# Patient Record
Sex: Male | Born: 1995 | Race: White | Hispanic: No | Marital: Single | Smoking: Never smoker
Health system: Southern US, Community
[De-identification: ages and names within clinical notes are randomized; demographics above are authoritative.]

## PROBLEM LIST (undated history)

## (undated) ENCOUNTER — Emergency Department: Admission: EM | Payer: Medicaid Other

## (undated) DIAGNOSIS — F952 Tourette's disorder: Secondary | ICD-10-CM

## (undated) DIAGNOSIS — F429 Obsessive-compulsive disorder, unspecified: Secondary | ICD-10-CM

## (undated) DIAGNOSIS — F909 Attention-deficit hyperactivity disorder, unspecified type: Secondary | ICD-10-CM

## (undated) DIAGNOSIS — R079 Chest pain, unspecified: Secondary | ICD-10-CM

## (undated) DIAGNOSIS — F419 Anxiety disorder, unspecified: Secondary | ICD-10-CM

## (undated) DIAGNOSIS — J45909 Unspecified asthma, uncomplicated: Secondary | ICD-10-CM

## (undated) HISTORY — DX: Unspecified asthma, uncomplicated: J45.909

## (undated) HISTORY — DX: Chest pain, unspecified: R07.9

## (undated) HISTORY — PX: INGUINAL HERNIA REPAIR: SUR1180

---
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
## 2017-01-12 ENCOUNTER — Ambulatory Visit: Payer: Self-pay | Admitting: Family Medicine

## 2018-04-13 ENCOUNTER — Encounter: Payer: Self-pay | Admitting: Emergency Medicine

## 2018-04-13 ENCOUNTER — Emergency Department: Payer: Self-pay

## 2018-04-13 ENCOUNTER — Emergency Department
Admission: EM | Admit: 2018-04-13 | Discharge: 2018-04-13 | Disposition: A | Discharge status: Home / Self Care | Payer: Self-pay | Attending: Student in an Organized Health Care Education/Training Program | Admitting: Student in an Organized Health Care Education/Training Program

## 2018-04-13 ENCOUNTER — Other Ambulatory Visit: Payer: Self-pay

## 2018-04-13 DIAGNOSIS — J45909 Unspecified asthma, uncomplicated: Secondary | ICD-10-CM | POA: Insufficient documentation

## 2018-04-13 DIAGNOSIS — F419 Anxiety disorder, unspecified: Secondary | ICD-10-CM

## 2018-04-13 HISTORY — DX: Tourette's disorder: F95.2

## 2018-04-13 LAB — CBC
HCT: 49.3 % (ref 40.0–52.0)
HEMOGLOBIN: 17.3 g/dL (ref 13.0–18.0)
MCH: 29.3 pg (ref 26.0–34.0)
MCHC: 35 g/dL (ref 32.0–36.0)
MCV: 83.7 fL (ref 80.0–100.0)
Platelets: 363 10*3/uL (ref 150–440)
RBC: 5.89 MIL/uL (ref 4.40–5.90)
RDW: 13.3 % (ref 11.5–14.5)
WBC: 11 10*3/uL — ABNORMAL HIGH (ref 3.8–10.6)

## 2018-04-13 LAB — FIBRIN DERIVATIVES D-DIMER (ARMC ONLY): Fibrin derivatives D-dimer (ARMC): 121.83 ng/mL (FEU) (ref 0.00–499.00)

## 2018-04-13 LAB — BASIC METABOLIC PANEL
ANION GAP: 11 (ref 5–15)
BUN: 17 mg/dL (ref 6–20)
CO2: 25 mmol/L (ref 22–32)
Calcium: 9.9 mg/dL (ref 8.9–10.3)
Chloride: 105 mmol/L (ref 98–111)
Creatinine, Ser: 0.69 mg/dL (ref 0.61–1.24)
GFR calc Af Amer: >60 mL/min (ref >60)
GFR calc non Af Amer: >60 mL/min (ref >60)
GLUCOSE: 98 mg/dL (ref 70–99)
POTASSIUM: 3.8 mmol/L (ref 3.5–5.1)
Sodium: 141 mmol/L (ref 135–145)

## 2018-04-13 LAB — TROPONIN I

## 2018-04-13 LAB — PHOSPHORUS: PHOSPHORUS: 3.8 mg/dL (ref 2.5–4.6)

## 2018-04-13 MED ORDER — LORAZEPAM 0.5 MG PO TABS: 0.5000 mg | ORAL_TABLET | Freq: Three times a day (TID) | ORAL | 0 refills | Status: AC | PRN

## 2018-04-13 MED ORDER — LORAZEPAM 0.5 MG PO TABS: 0.5000 mg | ORAL_TABLET | Freq: Four times a day (QID) | ORAL | Status: DC | PRN

## 2018-04-13 MED ORDER — LORAZEPAM 0.5 MG PO TABS
0.5000 mg | ORAL_TABLET | Freq: Once | ORAL | Status: AC
  Administered 2018-04-13: 0.5 mg via ORAL
  Filled 2018-04-13: qty 1

## 2018-04-13 NOTE — ED Triage Notes (Addendum)
Pt to ed with c/o shortness of breath and apnea when he tries to fall asleep.  Pt states he feels himself and then wakes up.  Reports this started yesterday and that he has not had sleep in the last 24 hours. Pt also reports a similar incident in 2014 and that he had a low phosphorous.

## 2018-04-13 NOTE — ED Provider Notes (Signed)
[facility] Emergency Department Provider Note    First MD Initiated Contact with Patient 04/13/18 1544     (approximate)  I have reviewed the triage vital signs and the nursing notes.   HISTORY  Chief Complaint Shortness of Breath    HPI Gerald Olsen is a 22 y.o. male history of Tourette's and asthma presents the ER chief complaint of shortness of breath only happens when he is trying to fall asleep.  States he will fall asleep and close his eyes for a few seconds and then wake up gasping for breath.  States is been happening worse over the past 2 days.  States this happened once before years ago when he was found to have low phosphorus.  He denies any chest pain or shortness of breath at this time.  States he did not get any sleep last night.  States that he is been feeling very anxious.  Is not taking medication for this or for Tourette's.  Denies any SI.  Has previously had passive thoughts of hurting himself but does not have any right now.  He is accompanied by his father who reiterates that he has no concern for self-harm the patient is otherwise behaving normally and was brought to the ER due to concern for these breathing spells which he has had in the past.    Past Medical History:  Diagnosis Date  . Asthma   . Chest pain   . Tourette's    Family History  Problem Relation Age of Onset  . Diabetes Father   . Heart disease Father   . Hyperlipidemia Father   . Hypertension Father    Past Surgical History:  Procedure Laterality Date  . INGUINAL HERNIA REPAIR  Infant   As Infant   There are no active problems to display for this patient.     Prior to Admission medications   Medication Sig Start Date End Date Taking? Authorizing Provider  LORazepam (ATIVAN) 0.5 MG tablet Take 1 tablet (0.5 mg total) by mouth every 8 (eight) hours as needed for anxiety. 04/13/18 04/13/19  Willy Eddyobinson, Ioanna Colquhoun, MD    Allergies Patient has no known  allergies.    Social History Social History   Tobacco Use  . Smoking status: Never Smoker  . Smokeless tobacco: Never Used  Substance Use Topics  . Alcohol use: No  . Drug use: Never    Review of Systems Patient denies headaches, rhinorrhea, blurry vision, numbness, shortness of breath, chest pain, edema, cough, abdominal pain, nausea, vomiting, diarrhea, dysuria, fevers, rashes or hallucinations unless otherwise stated above in HPI. ____________________________________________   PHYSICAL EXAM:  VITAL SIGNS: Vitals:   04/13/18 1605 04/13/18 1721  BP: (!) 161/82 133/68  Pulse: (!) 104 99  Resp: 16 18  Temp:    SpO2: 98% 99%    Constitutional: Alert and oriented.  Eyes: Conjunctivae are normal.  Head: Atraumatic. Nose: No congestion/rhinnorhea. Mouth/Throat: Mucous membranes are moist.   Neck: No stridor. Painless ROM.  Cardiovascular: Normal rate, regular rhythm. Grossly normal heart sounds.  Good peripheral circulation. Respiratory: Normal respiratory effort.  No retractions. Lungs CTAB. Gastrointestinal: Soft and nontender. No distention. No abdominal bruits. No CVA tenderness. Genitourinary:  Musculoskeletal: No lower extremity tenderness nor edema.  No joint effusions. Neurologic:  Normal speech and language. No gross focal neurologic deficits are appreciated. No facial droop Skin:  Skin is warm, dry and intact. No rash noted. Psychiatric: anxious but organized thoughts, Speech and behavior are normal.  ____________________________________________   LABS (all labs ordered are listed, but only abnormal results are displayed)  Results for orders placed or performed during the hospital encounter of 04/13/18 (from the past 24 hour(s))  Basic metabolic panel     Status: None   Collection Time: 04/13/18  2:01 PM  Result Value Ref Range   Sodium 141 135 - 145 mmol/L   Potassium 3.8 3.5 - 5.1 mmol/L   Chloride 105 98 - 111 mmol/L   CO2 25 22 - 32 mmol/L    Glucose, Bld 98 70 - 99 mg/dL   BUN 17 6 - 20 mg/dL   Creatinine, Ser 4.74 0.61 - 1.24 mg/dL   Calcium 9.9 8.9 - 25.9 mg/dL   GFR calc non Af Amer >60 >60 mL/min   GFR calc Af Amer >60 >60 mL/min   Anion gap 11 5 - 15  CBC     Status: Abnormal   Collection Time: 04/13/18  2:01 PM  Result Value Ref Range   WBC 11.0 (H) 3.8 - 10.6 K/uL   RBC 5.89 4.40 - 5.90 MIL/uL   Hemoglobin 17.3 13.0 - 18.0 g/dL   HCT 56.3 87.5 - 64.3 %   MCV 83.7 80.0 - 100.0 fL   MCH 29.3 26.0 - 34.0 pg   MCHC 35.0 32.0 - 36.0 g/dL   RDW 32.9 51.8 - 84.1 %   Platelets 363 150 - 440 K/uL  Troponin I     Status: None   Collection Time: 04/13/18  2:01 PM  Result Value Ref Range   Troponin I <0.03 <0.03 ng/mL  Phosphorus     Status: None   Collection Time: 04/13/18  2:01 PM  Result Value Ref Range   Phosphorus 3.8 2.5 - 4.6 mg/dL  Fibrin derivatives D-Dimer ([facility] only)     Status: None   Collection Time: 04/13/18  2:02 PM  Result Value Ref Range   Fibrin derivatives D-dimer ([facility]) 121.83 0.00 - 499.00 ng/mL (FEU)   ____________________________________________  EKG My review and personal interpretation at Time: 14:07  Indication: sob  Rate: 105  Rhythm: sinus Axis: normal  Other: normal intervals, no stemi ____________________________________________  RADIOLOGY  I personally reviewed all radiographic images ordered to evaluate for the above acute complaints and reviewed radiology reports and findings.  These findings were personally discussed with the patient.  Please see medical record for radiology report.  ____________________________________________   PROCEDURES  Procedure(s) performed:  Procedures    Critical Care performed: no ____________________________________________   INITIAL IMPRESSION / ASSESSMENT AND PLAN / ED COURSE  Pertinent labs & imaging results that were available during my care of the patient were reviewed by me and considered in my medical decision making (see chart for  details).   DDX: Asthma, copd, CHF, pna, ptx, malignancy, Pe, anemia   COTY LARSH is a 22 y.o. who presents to the ED with his as described above.  Patient arrives mildly tachycardic but no hypoxia.  No fever.  Risk heart score.  EKG shows no evidence of preexcitation syndrome or ischemia.  Low risk by [place].  D-dimer is negative.  Presentation seems most clinically consistent with panic.  Patient given trial dose of Ativan with improvement in symptoms.  He denies any SI or HI.  No intent for self-harm.  He does have good support and agrees to follow-up with [facility] and counselor as needed.      As part of my medical decision making, I reviewed the following data within the  electronic MEDICAL RECORD NUMBER Nursing notes reviewed and incorporated, Labs reviewed, notes from prior ED visits  ____________________________________________   FINAL CLINICAL IMPRESSION(S) / ED DIAGNOSES  Final diagnoses:  Anxiety      NEW MEDICATIONS STARTED DURING THIS VISIT:  There are no discharge medications for this patient.    Note:  This document was prepared using Dragon voice recognition software and may include unintentional dictation errors.    Willy Eddy, MD 04/13/18 [phone]

## 2018-04-13 NOTE — ED Notes (Signed)
Patient is restless on the stretcher. Patient has Tourette's and is constantly in motion. Patient is interacting easily with his father and this Clinical research associatewriter.

## 2018-04-17 ENCOUNTER — Ambulatory Visit: Payer: Self-pay | Admitting: Family Medicine

## 2018-04-18 ENCOUNTER — Other Ambulatory Visit: Payer: Self-pay

## 2018-04-18 ENCOUNTER — Ambulatory Visit: Payer: Self-pay | Admitting: Family Medicine

## 2019-04-14 ENCOUNTER — Encounter: Payer: Self-pay | Admitting: Emergency Medicine

## 2019-04-14 ENCOUNTER — Other Ambulatory Visit: Payer: Self-pay

## 2019-04-14 DIAGNOSIS — F329 Major depressive disorder, single episode, unspecified: Secondary | ICD-10-CM | POA: Insufficient documentation

## 2019-04-14 DIAGNOSIS — F419 Anxiety disorder, unspecified: Secondary | ICD-10-CM | POA: Insufficient documentation

## 2019-04-14 DIAGNOSIS — Z046 Encounter for general psychiatric examination, requested by authority: Secondary | ICD-10-CM | POA: Insufficient documentation

## 2019-04-14 DIAGNOSIS — R0989 Other specified symptoms and signs involving the circulatory and respiratory systems: Secondary | ICD-10-CM | POA: Insufficient documentation

## 2019-04-14 DIAGNOSIS — R1310 Dysphagia, unspecified: Secondary | ICD-10-CM | POA: Insufficient documentation

## 2019-04-14 DIAGNOSIS — J45909 Unspecified asthma, uncomplicated: Secondary | ICD-10-CM | POA: Insufficient documentation

## 2019-04-14 NOTE — ED Triage Notes (Signed)
Patient ambulatory to triage with steady gait, without difficulty or distress noted, mask in place; pt reports difficulty swallowing; denies recent illness; denies pain or irritation; st "just feels like nothing goes down"; denies any accomp symptoms

## 2019-04-15 ENCOUNTER — Emergency Department: Payer: Self-pay

## 2019-04-15 ENCOUNTER — Emergency Department
Admission: EM | Admit: 2019-04-15 | Discharge: 2019-04-15 | Disposition: A | Discharge status: Home / Self Care | Payer: Self-pay | Attending: Emergency Medicine | Admitting: Emergency Medicine

## 2019-04-15 DIAGNOSIS — R1310 Dysphagia, unspecified: Secondary | ICD-10-CM

## 2019-04-15 DIAGNOSIS — F419 Anxiety disorder, unspecified: Secondary | ICD-10-CM

## 2019-04-15 DIAGNOSIS — F329 Major depressive disorder, single episode, unspecified: Secondary | ICD-10-CM | POA: Diagnosis present

## 2019-04-15 DIAGNOSIS — F32A Depression, unspecified: Secondary | ICD-10-CM | POA: Diagnosis present

## 2019-04-15 HISTORY — DX: Attention-deficit hyperactivity disorder, unspecified type: F90.9

## 2019-04-15 NOTE — ED Provider Notes (Signed)
[facility] Emergency Department Provider Note  ____________________________________________   First MD Initiated Contact with Patient 04/15/19 [id number]     (approximate)  I have reviewed the triage vital signs and the nursing notes.   HISTORY  Chief Complaint Dysphagia    HPI Gerald Olsen is a 23 y.o. male with medical history as listed below who also fairly offers that he is wishing to transition from male to male.  He presents for evaluation of trouble swallowing.  He says it is been present for a few months and it seems to be getting worse.  He can swallow liquids without any difficulty but anytime he tries to eat any kind of food, including peanut butter or yogurt, he feels like he cannot swallow it.  He says it started after having an episode where he choked on a piece of chicken and it scared him very badly because he said he was going to die.  Since that time he can just be lying in bed thinking about it and feel like he is choking and food definitely makes it worse.  He was seen by his primary care doctor 2 months ago and [facility] bar[facility] swallow [facility], but he did not go to the study and has not had any additional follow-up.  He denies swelling.  He denies sore throat.  He has not had contact with COVID-19 patients.  He denies fever/chills, nausea, vomiting, abdominal pain, and dysuria.  He occasionally has some chest pain shortness of breath but this is chronic for him and attributable to his anxiety.  He was seen about a year ago for a variety of symptoms (not including the difficulty swallowing) and it was determined that anxiety was playing a role.  He was told he should follow-up with [facility] for mental health services but he admits that he did not follow-up as an outpatient.  Overall he describes his symptoms as severe and nothing in particular makes them better.  He is somewhat evasive when asked about suicidal ideation but states  that he does not want to die but sometimes he has "invasive thoughts".         Past Medical History:  Diagnosis Date  . ADHD   . Asthma   . Chest pain   . Tourette's     Patient Active Problem List   Diagnosis Date Noted  . Chest pain 04/09/2013    Past Surgical History:  Procedure Laterality Date  . INGUINAL HERNIA REPAIR  Infant   As Infant    Prior to Admission medications   Medication Sig Start Date End Date Taking? Authorizing Provider  acetaminophen-codeine (TYLENOL #3) 300-30 MG tablet TAKE 1-2 TABS BY MOUTH TWICE A DAY AS NEEDED 01/16/18   [provider]    Allergies Patient has no known allergies.  Family History  Problem Relation Age of Onset  . Diabetes Father   . Heart disease Father   . Hyperlipidemia Father   . Hypertension Father     Social History Social History   Tobacco Use  . Smoking status: Never Smoker  . Smokeless tobacco: Never Used  Substance Use Topics  . Alcohol use: No  . Drug use: Never    Review of Systems Constitutional: No fever/chills Eyes: No visual changes. ENT: No sore throat. Cardiovascular: Denies chest pain. Respiratory: Denies shortness of breath. Gastrointestinal: Difficulty swallowing anything other than liquids.  No abdominal pain.  No nausea, no vomiting.  No diarrhea.  No constipation. Genitourinary: Negative for dysuria. Musculoskeletal: Negative for neck pain.  Negative for back pain. Integumentary: Negative for rash. Neurological: Negative for headaches, focal weakness or numbness. Psychiatric:  Depression and anxiety, some OCD tendencies.  ____________________________________________   PHYSICAL EXAM:  VITAL SIGNS: ED Triage Vitals  Enc Vitals Group     BP 04/14/19 2331 (!) 148/79     Pulse Rate 04/14/19 2331 90     Resp 04/14/19 2331 20     Temp 04/14/19 2331 98.5 F (36.9 C)     Temp Source 04/14/19 2331 Oral     SpO2 04/14/19 2331 100 %     Weight 04/14/19 2332 80.1 kg (176 lb 8  oz)     Height --      Head Circumference --      Peak Flow --      Pain Score 04/14/19 2331 0     Pain Loc --      Pain Edu? --      Excl. in [place]? --     Constitutional: Alert and oriented. Well appearing and in no acute distress.  Comports himself and dresses himself in a more typically male fashion, consistent with his report to me that he wishes to transition from male to male. Eyes: Conjunctivae are normal.  Head: Atraumatic. Nose: No congestion/rhinnorhea. Mouth/Throat: No trismus.  Mucous membranes are moist.  Normal-appearing uvula, no evidence of peritonsillar abscess or acute infection of any sort. Neck: No stridor.  No meningeal signs.  No palpable lymphadenopathy, no induration or fluctuance. Cardiovascular: Normal rate, regular rhythm. Good peripheral circulation. Grossly normal heart sounds. Respiratory: Normal respiratory effort.  No retractions. No audible wheezing. Gastrointestinal: Soft and nontender. No distention.  Musculoskeletal: No lower extremity tenderness nor edema. No gross deformities of extremities. Neurologic:  Normal speech and language. No gross focal neurologic deficits are appreciated.  Skin:  Skin is warm, dry and intact. No rash noted. Psychiatric: The patient has occasional minor tics consistent with his history of Tourette's syndrome.  He is reporting anxiety and depression but denies active suicidality and has no plan and says he does not want to harm himself but he feels like he needs help with his depression.  ____________________________________________   LABS (all labs ordered are listed, but only abnormal results are displayed)  Labs Reviewed - No data to display ____________________________________________  EKG  No indication for EKG ____________________________________________  RADIOLOGY I, Hinda Kehr, personally viewed and evaluated these images (plain radiographs) as part of my medical decision making, as well as reviewing the  written report by the radiologist.  ED MD interpretation:  No acute abnormalities on radiographs  Official radiology report(s): Dg Neck Soft Tissue  Result Date: 04/15/2019 CLINICAL DATA:  Dysphagia. EXAM: NECK SOFT TISSUES - 1+ VIEW COMPARISON:  None. FINDINGS: There is no evidence of retropharyngeal soft tissue swelling or epiglottic enlargement. The cervical airway is unremarkable. No radio-opaque foreign body identified. Soft tissue planes are non suspicious. IMPRESSION: Negative soft tissue neck radiographs. Electronically Signed   By: Keith Rake M.D.   On: 04/15/2019 03:54    ____________________________________________   PROCEDURES   Procedure(s) performed (including Critical Care):  Procedures   ____________________________________________   INITIAL IMPRESSION / MDM / [place] / ED COURSE  As part of my medical decision making, I reviewed the following data within the [place] notes reviewed and incorporated, Old chart reviewed, Radiograph reviewed , Notes from prior ED visits and [place] Controlled Substance  Database   Differential diagnosis includes, but is not limited to, anxiety, depression, mood disorder, adjustment disorder, esophageal web, achalasia, any 1 of the number of other esophageal mechanical obstructions.  The patient has no sign or symptom of neck abscess or infection or hematoma nor a peritonsillar abscess and the symptoms have been going on for more than 2 months.  He very clearly describes to me an incident where he felt like he was choking to death and since that time he has had difficulty swallowing.  This seems more consistent with PTSD over his incident and he admits this is very likely.  After we talked a little bit about his need to follow-up with gastroenterology he brought up the more psychiatric nature of his concerns.  He feels very strongly that he would benefit from speaking with a psychiatrist and does admit  to some invasive thoughts, but I believe that he has good insight and judgment into his situation and I do not feel that he represents a danger to himself.  Of note, in reviewing the ED record from a year ago, his father was present at the time and was also adamant that the patient does not represent a danger to himself and they are concerned that he will be committed or hospitalized which they feel would be detrimental for him.  I agree that he does not meet involuntary commitment criteria but may benefit from psychiatric evaluation and follow-up as an outpatient.  Annice PihJackie the nurse practitioner with psychiatry is currently present in the department and I have asked her to evaluate him and see if she has any specific recommendations.  The patient is comfortable with this plan.  I will plan to discharge him after psychiatric evaluation with resources for outpatient follow-up with both gastroenterology and psychiatry.      Clinical Course as of Apr 14 606  Tue Apr 15, 2019  [id number] Annice PihJackie with psychiatry evaluted the patient.  They had a long talk and the patient felt better after speaking with Annice PihJackie.  She agrees with me that the patient does not meet any involuntary commitment criteria nor inpatient treatment criteria but would definitely benefit from [facility].  She also agrees with me that prescribed medications at this time without established follow-up would not be beneficial.  The patient is being provided with outpatient resources including [facility] information I am also providing some information and discharge paperwork.  I gave my usual customary return precautions.   [CF]    Clinical Course User Index [CF] Loleta RoseForbach, Charisa Twitty, MD     ____________________________________________  FINAL CLINICAL IMPRESSION(S) / ED DIAGNOSES  Final diagnoses:  Anxiety  Dysphagia, unspecified type     MEDICATIONS GIVEN DURING THIS VISIT:  Medications - No data to display   ED Discharge Orders     None      *Please note:  Gerald Olsen was evaluated in Emergency Department on 04/15/2019 for the symptoms described in the history of present illness. He was evaluated in the context of the global COVID-19 pandemic, which necessitated consideration that the patient might be at risk for infection with the SARS-CoV-2 virus that causes COVID-19. Institutional protocols and algorithms that pertain to the evaluation of patients at risk for COVID-19 are in a state of rapid change based on information released by regulatory bodies including the CDC and federal and state organizations. These policies and algorithms were followed during the patient's care in the ED.  Some ED evaluations and interventions may be  delayed as a result of limited staffing during the pandemic.*  Note:  This document was prepared using Dragon voice recognition software and may include unintentional dictation errors.   Loleta RoseForbach, Maansi Wike, MD 04/15/19 [phone]

## 2019-04-15 NOTE — Discharge Instructions (Signed)
As we discussed, I believe that you will benefit from seeing a gastroenterologist to rule out any mechanical issues with swallowing, but it is most likely that your issues arise from some of the psychiatric issues with which you are dealing, such as anxiety, OCD, etc.  We recommend that you call the office of Dr. Marius Ditch and schedule an outpatient follow-up visit, but we strongly encourage you to follow-up at [facility] for additional mental health services.  Return immediately to the emergency department if you develop any thoughts or plan for harming yourself or anyone else.

## 2019-04-15 NOTE — Consult Note (Signed)
[facility] Face-to[facility]Face [facility]iat[facility] Consult   Reason for Consult:  Dysphagia  Referring Physician: Dr. York CeriseForbach Patient Identification: Gerald Olsen MRN:  [id number] Principal Diagnosis: <principal problem not specified> Diagnosis:  Active Problems:   Depression   Total Time spent with patient: 1 hour  Subjective: " Are you able to prescribe me some medication? Gerald Olsen is a 23 y.o. male patient presented to [facility] ED via POV voluntarily.  This is a male who is currently transitioning to a male. She preferred to be called [name].  The patient was seen face-to-face by this provider; chart reviewed and consulted with Dr. York CeriseForbach on 04/15/2019 due to the care of the patient. It was discussed with the  provider that the patient does not meet criteria to be admitted to the inpatient unit.  On evaluation the patient is alert and oriented x4, anxious, but cooperative, and mood-congruent with affect.  During the patient assessment she voice several questions on gender dysphoria along with her starting hormonal therapy.  The patient discuss lots of family dynamics due to her transitioning. She discussed that her mom is more accepting to her choice than dad.  The patient admits to being diagnosed with ADHD and Tourette's syndrome but not OCD.  She explained having difficulty dealing with rituals wish she voiced it has consumed her life. The patient does not appear to be responding to internal or external stimuli. Neither is the patient presenting with any delusional thinking. The patient denies auditory or visual hallucinations. The patient denies any suicidal, homicidal, or self-harm ideations. She voiced that she love her life and would never do anything to harm herself or anyone else. The patient is not presenting with any psychotic or paranoid behaviors.  This provider spoke with TTS Ms. Christell ConstantMoore in requesting resources due to the patient not having health insurance at this time.  The patient discussed  needing providers who specializes gender dysphoria.  HPI: Per Dr. Henriette CombsFobach Gerald Olsen is a 23 y.o. male with medical history as listed below who also fairly offers that he is wishing to transition from male to male.  He presents for evaluation of trouble swallowing.  He says it is been present for a few months and it seems to be getting worse.  He can swallow liquids without any difficulty but anytime he tries to eat any kind of food, including peanut butter or yogurt, he feels like he cannot swallow it.  He says it started after having an episode where he choked on a piece of chicken and it scared him very badly because he said he was going to die.  Since that time he can just be lying in bed thinking about it and feel like he is choking and food definitely makes it worse.  He was seen by his primary care doctor 2 months ago and [facility], but he did not go to the study and has not had any additional follow-up.  He denies swelling.  He denies sore throat.  He has not had contact with COVID-19 patients.  He denies fever/chills, nausea, vomiting, abdominal pain, and dysuria.  He occasionally has some chest pain shortness of breath but this is chronic for him and attributable to his anxiety.  He was seen about a year ago for a variety of symptoms (not including the difficulty swallowing) and it was determined that anxiety was playing a role.  He was told he should follow-up with [facility] for mental health services  but he admits that he did not follow-up as an outpatient.  Overall he describes his symptoms as severe and nothing in particular makes them better.  He is somewhat evasive when asked about suicidal ideation but states that he does not want to die but sometimes he has "invasive thoughts".        Past Psychiatric History:  ADHD  Risk to Self:  No Risk to Others:  No Prior Inpatient Therapy:  No Prior Outpatient Therapy:   No  Past Medical  History:  Past Medical History:  Diagnosis Date  . ADHD   . Asthma   . Chest pain   . Tourette's     Past Surgical History:  Procedure Laterality Date  . INGUINAL HERNIA REPAIR  Infant   As Infant   Family History:  Family History  Problem Relation Age of Onset  . Diabetes Father   . Heart disease Father   . Hyperlipidemia Father   . Hypertension Father    Family Psychiatric  History: None Social History:  Social History   Substance and Sexual Activity  Alcohol Use No     Social History   Substance and Sexual Activity  Drug Use Never    Social History   Socioeconomic History  . Marital status: Single    Spouse name: Not on file  . Number of children: Not on file  . Years of education: Not on file  . Highest education level: Not on file  Occupational History  . Not on file  Social Needs  . Financial resource strain: Not on file  . Food insecurity    Worry: Not on file    Inability: Not on file  . Transportation needs    Medical: Not on file    Non-medical: Not on file  Tobacco Use  . Smoking status: Never Smoker  . Smokeless tobacco: Never Used  Substance and Sexual Activity  . Alcohol use: No  . Drug use: Never  . Sexual activity: Not on file  Lifestyle  . Physical activity    Days per week: Not on file    Minutes per session: Not on file  . Stress: Not on file  Relationships  . Social Herbalist on phone: Not on file    Gets together: Not on file    Attends religious service: Not on file    Active member of club or organization: Not on file    Attends meetings of clubs or organizations: Not on file    Relationship status: Not on file  Other Topics Concern  . Not on file  Social History Narrative  . Not on file   Additional Social History:    Allergies:  No Known Allergies  Labs: No results found for this or any previous visit (from the past 48 hour(s)).  No current facility-administered medications for this encounter.     Current Outpatient Medications  Medication Sig Dispense Refill  . acetaminophen-codeine (TYLENOL #3) 300-30 MG tablet TAKE 1-2 TABS BY MOUTH TWICE A DAY AS NEEDED  0    Musculoskeletal: Strength & Muscle Tone: within normal limits Gait & Station: normal Patient leans: N/A  Psychiatric Specialty Exam: Physical Exam  Nursing note and vitals reviewed. Constitutional: He is oriented to person, place, and time. He appears well-developed and well-nourished.  HENT:  Head: Normocephalic.  Eyes: Pupils are equal, round, and reactive to light. Conjunctivae are normal.  Neck: Normal range of motion. Neck supple.  Cardiovascular: Normal rate  and regular rhythm.  Respiratory: Effort normal and breath sounds normal.  Musculoskeletal: Normal range of motion.  Neurological: He is alert and oriented to person, place, and time.  Skin: Skin is warm and dry.  Psychiatric: He has a normal mood and affect. Judgment and thought content normal.    Review of Systems  Psychiatric/Behavioral: Positive for depression. The patient is nervous/anxious.   All other systems reviewed and are negative.   Blood pressure 131/75, pulse 71, temperature 98.5 F (36.9 C), temperature source Oral, resp. rate 16, weight 80.1 kg, SpO2 97 %.There is no height or weight on file to calculate BMI.  General Appearance: Fairly Groomed  Eye Contact:  Fair  Speech:  Clear and Coherent  Volume:  Decreased  Mood:  Anxious and Depressed  Affect:  Appropriate  Thought Process:  Coherent  Orientation:  Full (Time, Place, and Person)  Thought Content:  Logical  Suicidal Thoughts:  No  Homicidal Thoughts:  No  Memory:  Recent;   Good Remote;   Good  Judgement:  Good  Insight:  Good  Psychomotor Activity:  Normal  Concentration:  Concentration: Good and Attention Span: Good  Recall:  Good  Fund of Knowledge:  Good  Language:  Good  Akathisia:  Negative  Handed:  Right  AIMS (if indicated):   No  Assets:  Desire for  Improvement Financial Resources/Insurance Social Support  ADL's:  Intact  Cognition:  WNL  Sleep:   Okay     Treatment Plan Summary: Daily contact with patient to assess and evaluate symptoms and progress in treatment and Plan The patient does not meet criteria for psychiatric inpatient admission.  Disposition: No evidence of imminent risk to self or others at present.   Patient does not meet criteria for psychiatric inpatient admission. Supportive therapy provided about ongoing stressors. Refer to IOP. Discussed crisis plan, support from social network, calling 911, coming to the Emergency Department, and calling Suicide Hotline.  Catalina GravelJacqueline Thomspon, NP 04/15/2019 6:21 AM

## 2019-04-16 ENCOUNTER — Telehealth: Payer: Self-pay | Admitting: Gastroenterology

## 2019-04-16 NOTE — Telephone Encounter (Signed)
I called patient & l/m with mother to have patient call to schedule an appointment with Dr Marius Ditch for ED f/u.

## 2019-05-14 ENCOUNTER — Encounter: Payer: Self-pay | Admitting: Gastroenterology

## 2019-05-14 ENCOUNTER — Other Ambulatory Visit: Payer: Self-pay

## 2019-05-14 ENCOUNTER — Ambulatory Visit (INDEPENDENT_AMBULATORY_CARE_PROVIDER_SITE_OTHER): Payer: Self-pay | Admitting: Gastroenterology

## 2019-05-14 VITALS — BP 120/79 | HR 101 | Temp 98.7°F | Resp 18 | Wt 174.0 lb

## 2019-05-14 DIAGNOSIS — R1310 Dysphagia, unspecified: Secondary | ICD-10-CM

## 2019-05-14 NOTE — Progress Notes (Signed)
Arlyss Repressohini R Vanga, MD [address]  [address]  [place], [place] [address]  Main: [phone]  Fax: [phone]    Gastroenterology Consultation  Referring Provider:     No ref. provider found Primary Care Physician:  Patient, No Pcp Per Primary Gastroenterologist:  Dr. Arlyss Repressohini R Vanga Reason for Consultation:     Dysphagia        HPI:   Wenda Overlandnthony E Berrios is a 23 y.o. male referred [facility] ER for consultation & management of dysphagia.  Patient reports about 2 months history of difficulty swallowing solids only.  He reports that about 1 year ago, he had an episode of choking sensation when he was eating chili and accidentally swallowed a big piece.  He thinks he may have developed PTSD since then.  However, he did not have these episodes until 2 months ago.  He has lost few pounds due to difficulty swallowing.  He denies any dysphagia to liquids.  He denies any other symptoms.  He took over-the-counter antacid which did not provide much relief He denies food allergies or atopic allergies. He does not smoke or drink alcohol or denies any drug abuse NSAIDs: None  Antiplts/Anticoagulants/Anti thrombotics: None  GI Procedures: None He denies family history of GI conditions such as reasonably esophagitis, IBD, GI malignancy  Past Medical History:  Diagnosis Date  . ADHD   . Asthma   . Chest pain   . Tourette's     Past Surgical History:  Procedure Laterality Date  . INGUINAL HERNIA REPAIR  Infant   As Infant    Current Outpatient Medications:  .  acetaminophen-codeine (TYLENOL #3) 300-30 MG tablet, TAKE 1-2 TABS BY MOUTH TWICE A DAY AS NEEDED, Disp: , Rfl: 0    Family History  Problem Relation Age of Onset  . Diabetes Father   . Heart disease Father   . Hyperlipidemia Father   . Hypertension Father      Social History   Tobacco Use  . Smoking status: Never Smoker  . Smokeless tobacco: Never Used  Substance Use Topics  . Alcohol use: No  . Drug use: Never     Allergies as of 05/14/2019  . (No Known Allergies)    Review of Systems:    All systems reviewed and negative except where noted in HPI.   Physical Exam:  BP 120/79 (BP Location: Left Arm, Patient Position: Sitting, Cuff Size: Large)   Pulse (!) 101   Temp 98.7 F (37.1 C)   Resp 18   Wt 174 lb (78.9 kg)  No LMP for male patient.  General:   Alert,  Well-developed, well-nourished, pleasant and cooperative in NAD Head:  Normocephalic and atraumatic. Eyes:  Sclera clear, no icterus.   Conjunctiva pink. Ears:  Normal auditory acuity. Nose:  No deformity, discharge, or lesions. Mouth:  No deformity or lesions,oropharynx pink & moist. Neck:  Supple; no masses or thyromegaly. Lungs:  Respirations even and unlabored.  Clear throughout to auscultation.   No wheezes, crackles, or rhonchi. No acute distress. Heart:  Regular rate and rhythm; no murmurs, clicks, rubs, or gallops. Abdomen:  Normal bowel sounds. Soft, non-tender and non-distended without masses, hepatosplenomegaly or hernias noted.  No guarding or rebound tenderness.   Rectal: Not performed Msk:  Symmetrical without gross deformities. Good, equal movement & strength bilaterally. Pulses:  Normal pulses noted. Extremities:  No clubbing or edema.  No cyanosis. Neurologic:  Alert and oriented x3;  grossly normal neurologically. Skin:  Intact  without significant lesions or rashes. No jaundice. Psych:  Alert and cooperative. Normal mood and affect.  Imaging Studies: Reviewed  Assessment and Plan:   KYSHON TOLLIVER is a 23 y.o. male referred for evaluation of dysphagia to solids with weight loss.  Given his age and ethnicity, recommend EGD to rule out eosinophilic esophagitis  I have discussed alternative options, risks & benefits,  which include, but are not limited to, bleeding, infection, perforation,respiratory complication & drug reaction.  The patient agrees with this plan & written consent will be obtained.       Follow up based on the EGD results   Cephas Darby, MD

## 2019-05-19 ENCOUNTER — Other Ambulatory Visit: Payer: Self-pay

## 2019-05-19 ENCOUNTER — Other Ambulatory Visit
Admission: RE | Admit: 2019-05-19 | Discharge: 2019-05-19 | Disposition: A | Discharge status: Home / Self Care | Payer: HRSA Program | Source: Ambulatory Visit | Attending: Gastroenterology | Admitting: Gastroenterology

## 2019-05-19 DIAGNOSIS — Z20828 Contact with and (suspected) exposure to other viral communicable diseases: Secondary | ICD-10-CM | POA: Insufficient documentation

## 2019-05-19 DIAGNOSIS — Z01812 Encounter for preprocedural laboratory examination: Secondary | ICD-10-CM | POA: Diagnosis present

## 2019-05-20 LAB — SARS CORONAVIRUS 2 (TAT 6-24 HRS): SARS Coronavirus 2: NEGATIVE

## 2019-05-21 ENCOUNTER — Telehealth: Payer: Self-pay | Admitting: Gastroenterology

## 2019-05-21 DIAGNOSIS — R1310 Dysphagia, unspecified: Secondary | ICD-10-CM

## 2019-05-21 DIAGNOSIS — R1319 Other dysphagia: Secondary | ICD-10-CM

## 2019-05-21 MED ORDER — OMEPRAZOLE 40 MG PO CPDR: 40.0000 mg | DELAYED_RELEASE_CAPSULE | Freq: Every day | ORAL | 2 refills | Status: DC

## 2019-05-21 NOTE — Telephone Encounter (Signed)
Patient canceled his EGD as he cannot afford self-pay which is about $3000 for the procedure.  Patient does not insurance and is trying to get one.  In the meantime, I suggested him to try omeprazole 40 mg daily  I will see him for follow-up in 1 month  Cephas Darby, MD [address]  [place]  [place], [place] [address]  Main: [phone]  Fax: [phone] Pager: [phone]

## 2019-05-22 ENCOUNTER — Encounter: Admission: RE | Payer: Self-pay | Source: Home / Self Care

## 2019-05-22 ENCOUNTER — Ambulatory Visit: Admission: RE | Admit: 2019-05-22 | Payer: Self-pay | Source: Home / Self Care | Admitting: Gastroenterology

## 2019-05-22 SURGERY — ESOPHAGOGASTRODUODENOSCOPY (EGD) WITH PROPOFOL
Anesthesia: General

## 2019-05-23 ENCOUNTER — Telehealth: Payer: Self-pay | Admitting: Gastroenterology

## 2019-05-23 NOTE — Telephone Encounter (Signed)
-----   Message from Lin Landsman, MD sent at 05/21/2019  3:59 PM EDT ----- Regarding: Follow-up Schedule follow-up with me in the next 1 to 2 months  RV

## 2019-05-23 NOTE — Telephone Encounter (Signed)
Unable to leave vm to schedule apt

## 2019-05-28 ENCOUNTER — Telehealth: Payer: Self-pay | Admitting: Gastroenterology

## 2019-05-28 ENCOUNTER — Encounter: Payer: Self-pay | Admitting: Gastroenterology

## 2019-05-28 NOTE — Telephone Encounter (Signed)
Unable to contact pt. Letter sent °

## 2019-05-28 NOTE — Telephone Encounter (Signed)
-----   Message from Rohini Reddy Vanga, MD sent at 05/21/2019  3:59 PM EDT ----- °Regarding: Follow-up °Schedule follow-up with me in the next 1 to 2 months ° °RV ° °

## 2021-08-26 ENCOUNTER — Ambulatory Visit: Payer: Self-pay | Admitting: Cardiology

## 2021-08-29 ENCOUNTER — Encounter: Payer: Self-pay | Admitting: Cardiology

## 2021-10-15 ENCOUNTER — Encounter: Payer: Self-pay | Admitting: Emergency Medicine

## 2021-10-15 ENCOUNTER — Emergency Department
Admission: EM | Admit: 2021-10-15 | Discharge: 2021-10-16 | Disposition: A | Discharge status: Home / Self Care | Payer: Medicaid Other | Attending: Emergency Medicine | Admitting: Emergency Medicine

## 2021-10-15 ENCOUNTER — Other Ambulatory Visit: Payer: Self-pay

## 2021-10-15 DIAGNOSIS — T7840XA Allergy, unspecified, initial encounter: Secondary | ICD-10-CM | POA: Diagnosis not present

## 2021-10-15 DIAGNOSIS — R220 Localized swelling, mass and lump, head: Secondary | ICD-10-CM | POA: Diagnosis present

## 2021-10-15 MED ORDER — PREDNISONE 20 MG PO TABS
60.0000 mg | ORAL_TABLET | Freq: Once | ORAL | Status: AC
  Administered 2021-10-16: 60 mg via ORAL
  Filled 2021-10-15: qty 3

## 2021-10-15 MED ORDER — EPINEPHRINE 0.3 MG/0.3ML IJ SOAJ: 0.3000 mg | Freq: Once | INTRAMUSCULAR | Status: AC

## 2021-10-15 MED ORDER — PREDNISONE 10 MG PO TABS: 20.0000 mg | ORAL_TABLET | Freq: Every day | ORAL | 0 refills | Status: DC

## 2021-10-15 MED ORDER — LACTATED RINGERS IV BOLUS
1000.0000 mL | Freq: Once | INTRAVENOUS | Status: AC
  Administered 2021-10-15: 1000 mL via INTRAVENOUS

## 2021-10-15 MED ORDER — FAMOTIDINE IN NACL 20-0.9 MG/50ML-% IV SOLN
20.0000 mg | Freq: Once | INTRAVENOUS | Status: AC
  Administered 2021-10-15: 20 mg via INTRAVENOUS
  Filled 2021-10-15: qty 50

## 2021-10-15 MED ORDER — EPINEPHRINE 0.3 MG/0.3ML IJ SOAJ
INTRAMUSCULAR | Status: AC
  Administered 2021-10-15: 0.3 mg via INTRAMUSCULAR
  Filled 2021-10-15: qty 0.3

## 2021-10-15 MED ORDER — METHYLPREDNISOLONE SODIUM SUCC 125 MG IJ SOLR
125.0000 mg | Freq: Once | INTRAMUSCULAR | Status: AC
  Administered 2021-10-15: 125 mg via INTRAVENOUS
  Filled 2021-10-15: qty 2

## 2021-10-15 MED ORDER — DIPHENHYDRAMINE HCL 50 MG/ML IJ SOLN
50.0000 mg | Freq: Once | INTRAMUSCULAR | Status: AC
  Administered 2021-10-15: 50 mg via INTRAVENOUS
  Filled 2021-10-15: qty 1

## 2021-10-15 NOTE — ED Provider Notes (Signed)
[facility] Provider Note    Event Date/Time   First MD Initiated Contact with Patient 10/15/21 1856     (approximate)   History   Allergic Reaction   HPI  Gerald Olsen is a 26 y.o. male who reports he was eating shrimp and now thinks he may be having an allergic reaction.  He said his throat felt like it was closing up his lower lip Swollen.  He did have some alcohol on board.  He is very anxious.  He says he has ADHD and obsessive-compulsive disorder and Tourette's syndrome.  He got the epinephrine and is now feeling somewhat better but is very shaky.  His throat is better his lower lip is not swelling any longer      Physical Exam   Triage Vital Signs: ED Triage Vitals [10/15/21 1847]  Enc Vitals Group     BP 126/76     Pulse Rate (!) 126     Resp 20     Temp      Temp src      SpO2 97 %     Weight 240 lb (108.9 kg)     Height 5\' 8"  (1.727 m)     Head Circumference      Peak Flow      Pain Score 10     Pain Loc      Pain Edu?      Excl. in GC?     Most recent vital signs: Vitals:   10/15/21 1945 10/15/21 2005  BP:  117/62  Pulse: (!) 107 100  Resp: (!) 23 18  SpO2: 96% 98%   General: Awake, no distress.  But very shaky Mouth: No erythema or exudate looks normal CV:  Good peripheral perfusion.  Heart regular rate and rhythm slightly tacky but no longer 126 more like 105. Resp:  Normal effort.  Lungs are clear Abd:  No distention.  Not tender Extremities: No edema   ED Results / Procedures / Treatments   Labs (all labs ordered are listed, but only abnormal results are displayed) Labs Reviewed - No data to display   EKG     RADIOLOGY    PROCEDURES:  Critical Care performed:   Procedures   MEDICATIONS ORDERED IN ED: Medications  predniSONE (DELTASONE) tablet 60 mg (has no administration in time range)  EPINEPHrine (EPI-PEN) injection 0.3 mg (0.3 mg Intramuscular Given 10/15/21 1855)  methylPREDNISolone  sodium succinate (SOLU-MEDROL) 125 mg/2 mL injection 125 mg (125 mg Intravenous Given 10/15/21 1920)  diphenhydrAMINE (BENADRYL) injection 50 mg (50 mg Intravenous Given 10/15/21 1918)  famotidine (PEPCID) IVPB 20 mg premix (0 mg Intravenous Stopped 10/15/21 2008)  lactated ringers bolus 1,000 mL (0 mLs Intravenous Stopped 10/15/21 2040)     IMPRESSION / MDM / ASSESSMENT AND PLAN / ED COURSE  I reviewed the triage vital signs and the nursing notes.                    ----------------------------------------- 11:52 PM on 10/15/2021 ----------------------------------------- Patient's lip swelling is gone patient has no rash or itching patient reports he has a sore throat but otherwise he is doing well he has no stridor no nodes no redness mouth and inside throat does not look swollen.  Patient's mom is ready to take him home.          Patient looks like he had an allergic possibly anaphylactic reaction.  He has been stable now for over 5  hours.  He got epinephrine Benadryl Solu-Medrol and Pepcid.  These medicines were IV.  I will let him go with prednisone 1 dose here and then 20 mg a day for 2 days.  Benadryl at home.  I have spoken with them about calling 911 if he gets short of breath at home which I do not anticipate.      FINAL CLINICAL IMPRESSION(S) / ED DIAGNOSES   Final diagnoses:  Allergic reaction, initial encounter     Rx / DC Orders   ED Discharge Orders          Ordered    predniSONE (DELTASONE) 10 MG tablet  Daily with breakfast        10/15/21 2347             Note:  This document was prepared using Dragon voice recognition software and may include unintentional dictation errors.   Arnaldo Natal, MD 10/15/21 [phone]

## 2021-10-15 NOTE — Discharge Instructions (Addendum)
Take the prednisone 2 pills a day tomorrow and the next day.  Take it with food.  That should help prevent the reaction from coming back.  Take Benadryl 2 of the over-the-counter pills or 50 mg total before bed and then once in the morning and once 6 hours after that tomorrow.  Return for any signs of returning reaction like itching or swelling or trouble breathing.  In fact if you have trouble breathing please call 911.  They can start treating you when they get there.

## 2021-10-15 NOTE — ED Triage Notes (Signed)
Pt via POV from home. Possible allergic reaction to shrimp. Pt states he feels like his throat is closing up. Lower lip swollen. No hives or rash noted. Pt does have ETOH on board. Pt is A&OX4 but very anxious.

## 2021-10-16 MED ORDER — PREDNISONE 10 MG PO TABS: 20.0000 mg | ORAL_TABLET | Freq: Every day | ORAL | 0 refills | Status: DC

## 2021-11-10 ENCOUNTER — Emergency Department
Admission: EM | Admit: 2021-11-10 | Discharge: 2021-11-10 | Disposition: A | Discharge status: Home / Self Care | Payer: Medicaid Other | Attending: Emergency Medicine | Admitting: Emergency Medicine

## 2021-11-10 ENCOUNTER — Encounter: Payer: Self-pay | Admitting: Emergency Medicine

## 2021-11-10 ENCOUNTER — Other Ambulatory Visit: Payer: Self-pay

## 2021-11-10 DIAGNOSIS — Y900 Blood alcohol level of less than 20 mg/100 ml: Secondary | ICD-10-CM | POA: Diagnosis not present

## 2021-11-10 DIAGNOSIS — E860 Dehydration: Secondary | ICD-10-CM | POA: Insufficient documentation

## 2021-11-10 DIAGNOSIS — R112 Nausea with vomiting, unspecified: Secondary | ICD-10-CM | POA: Diagnosis not present

## 2021-11-10 DIAGNOSIS — F10129 Alcohol abuse with intoxication, unspecified: Secondary | ICD-10-CM | POA: Diagnosis not present

## 2021-11-10 LAB — CBC WITH DIFFERENTIAL/PLATELET
Abs Immature Granulocytes: 0.07 10*3/uL (ref 0.00–0.07)
Basophils Absolute: 0.1 10*3/uL (ref 0.0–0.1)
Basophils Relative: 0 %
Eosinophils Absolute: 0.4 10*3/uL (ref 0.0–0.5)
Eosinophils Relative: 2 %
HCT: 54.4 % — ABNORMAL HIGH (ref 39.0–52.0)
Hemoglobin: 18.8 g/dL — ABNORMAL HIGH (ref 13.0–17.0)
Immature Granulocytes: 0 %
Lymphocytes Relative: 7 %
Lymphs Abs: 1.2 10*3/uL (ref 0.7–4.0)
MCH: 28.6 pg (ref 26.0–34.0)
MCHC: 34.6 g/dL (ref 30.0–36.0)
MCV: 82.8 fL (ref 80.0–100.0)
Monocytes Absolute: 1.2 10*3/uL — ABNORMAL HIGH (ref 0.1–1.0)
Monocytes Relative: 7 %
Neutro Abs: 15.1 10*3/uL — ABNORMAL HIGH (ref 1.7–7.7)
Neutrophils Relative %: 84 %
Platelets: 426 10*3/uL — ABNORMAL HIGH (ref 150–400)
RBC: 6.57 MIL/uL — ABNORMAL HIGH (ref 4.22–5.81)
RDW: 13.2 % (ref 11.5–15.5)
WBC: 18 10*3/uL — ABNORMAL HIGH (ref 4.0–10.5)
nRBC: 0 % (ref 0.0–0.2)

## 2021-11-10 LAB — COMPREHENSIVE METABOLIC PANEL
ALT: 65 U/L — ABNORMAL HIGH (ref 0–44)
AST: 33 U/L (ref 15–41)
Albumin: 5 g/dL (ref 3.5–5.0)
Alkaline Phosphatase: 93 U/L (ref 38–126)
Anion gap: 10 (ref 5–15)
BUN: 12 mg/dL (ref 6–20)
CO2: 24 mmol/L (ref 22–32)
Calcium: 9.9 mg/dL (ref 8.9–10.3)
Chloride: 106 mmol/L (ref 98–111)
Creatinine, Ser: 0.83 mg/dL (ref 0.61–1.24)
GFR, Estimated: >60 mL/min (ref >60)
Glucose, Bld: 123 mg/dL — ABNORMAL HIGH (ref 70–99)
Potassium: 3.8 mmol/L (ref 3.5–5.1)
Sodium: 140 mmol/L (ref 135–145)
Total Bilirubin: 1 mg/dL (ref 0.3–1.2)
Total Protein: 8.8 g/dL — ABNORMAL HIGH (ref 6.5–8.1)

## 2021-11-10 LAB — URINALYSIS, ROUTINE W REFLEX MICROSCOPIC
Bacteria, UA: NONE SEEN
Bilirubin Urine: NEGATIVE
Glucose, UA: NEGATIVE mg/dL
Hgb urine dipstick: NEGATIVE
Ketones, ur: 5 mg/dL — AB
Leukocytes,Ua: NEGATIVE
Nitrite: NEGATIVE
Protein, ur: 100 mg/dL — AB
Specific Gravity, Urine: 1.029 (ref 1.005–1.030)
pH: 5 (ref 5.0–8.0)

## 2021-11-10 LAB — URINE DRUG SCREEN, QUALITATIVE (ARMC ONLY)
Amphetamines, Ur Screen: NOT DETECTED
Barbiturates, Ur Screen: NOT DETECTED
Benzodiazepine, Ur Scrn: NOT DETECTED
Cannabinoid 50 Ng, Ur ~~LOC~~: NOT DETECTED
Cocaine Metabolite,Ur ~~LOC~~: NOT DETECTED
MDMA (Ecstasy)Ur Screen: NOT DETECTED
Methadone Scn, Ur: NOT DETECTED
Opiate, Ur Screen: NOT DETECTED
Phencyclidine (PCP) Ur S: NOT DETECTED
Tricyclic, Ur Screen: NOT DETECTED

## 2021-11-10 LAB — LIPASE, BLOOD: Lipase: 28 U/L (ref 11–51)

## 2021-11-10 LAB — ETHANOL: Alcohol, Ethyl (B): <10 mg/dL (ref <10)

## 2021-11-10 MED ORDER — LACTATED RINGERS IV BOLUS
2000.0000 mL | Freq: Once | INTRAVENOUS | Status: AC
Start: 2021-11-10 — End: 2021-11-10
  Administered 2021-11-10: 2000 mL via INTRAVENOUS

## 2021-11-10 MED ORDER — ONDANSETRON 4 MG PO TBDP: 4.0000 mg | ORAL_TABLET | Freq: Three times a day (TID) | ORAL | 0 refills | Status: DC | PRN

## 2021-11-10 MED ORDER — ONDANSETRON HCL 4 MG/2ML IJ SOLN
4.0000 mg | Freq: Once | INTRAMUSCULAR | Status: AC
  Administered 2021-11-10: 4 mg via INTRAVENOUS
  Filled 2021-11-10: qty 2

## 2021-11-10 NOTE — ED Triage Notes (Signed)
EMS brings pt in from home; pt reports drank 2 bottles wine over last 3hrs; now with N/V/D

## 2021-11-10 NOTE — ED Provider Notes (Signed)
Assumed care from Dr. Katrinka Blazing at 7 AM. Briefly, the patient is a 26 y.o. male with PMHx of  has a past medical history of ADHD, Asthma, Chest pain, and Tourette's. here with nausea, vomiting, diarrhea. Abdomen is soft, NT, ND. Plan to hydrate, likely d/c.Marland Kitchen   Labs Reviewed  CBC WITH DIFFERENTIAL/PLATELET - Abnormal; Notable for the following components:      Result Value   WBC 18.0 (*)    RBC 6.57 (*)    Hemoglobin 18.8 (*)    HCT 54.4 (*)    Platelets 426 (*)    Neutro Abs 15.1 (*)    Monocytes Absolute 1.2 (*)    All other components within normal limits  COMPREHENSIVE METABOLIC PANEL - Abnormal; Notable for the following components:   Glucose, Bld 123 (*)    Total Protein 8.8 (*)    ALT 65 (*)    All other components within normal limits  URINALYSIS, ROUTINE W REFLEX MICROSCOPIC - Abnormal; Notable for the following components:   Color, Urine AMBER (*)    APPearance HAZY (*)    Ketones, ur 5 (*)    Protein, ur 100 (*)    All other components within normal limits  LIPASE, BLOOD  ETHANOL  URINE DRUG SCREEN, QUALITATIVE ([facility] ONLY)    Course of Care: Heart rate below 100.  Patient is asymptomatic with no pain.  No further vomiting.  Will discharge with supportive care and Zofran as needed.  Return precautions given.     Shaune Pollack, MD 11/10/21 [phone]

## 2021-11-10 NOTE — ED Notes (Signed)
Pt tolerating PO

## 2021-11-10 NOTE — ED Notes (Signed)
Pt unable to sign for discharge d/t no signature pad °

## 2021-11-10 NOTE — ED Provider Notes (Signed)
[facility] Provider Note    None    (approximate)   History   Alcohol Intoxication and Emesis   HPI  Gerald Olsen is a 26 y.o. male who presents to the ED for evaluation of Alcohol Intoxication and Emesis   Pt presents to the ED for evaluation of recurrent emesis and diarrhea in the setting of ethanol intake.  He reports he lives at home with both his parents.  He was drinking by himself this evening, 2 bottles of white wine without additional recreational drug intake.  Reports he felt fine before drinking the ethanol without recent GI illnesses.  After he finished with bottles, he reports developing nausea and recurrent nonbloody nonbilious emesis.  Reports at least 7-10 episodes.  Reports a couple episodes of watery diarrhea as well.  Denies any suicidal intent or that he was trying to harm himself. Denies abd pain, dysuria, hematuria. Reports feeling dehydrated.  Physical Exam   Triage Vital Signs: ED Triage Vitals  Enc Vitals Group     BP 11/10/21 0421 108/62     Pulse Rate 11/10/21 0421 (!) 120     Resp 11/10/21 0421 20     Temp 11/10/21 0421 98.4 F (36.9 C)     Temp src --      SpO2 11/10/21 0421 97 %     Weight 11/10/21 0420 240 lb (108.9 kg)     Height 11/10/21 0420 5\' 8"  (1.727 m)     Head Circumference --      Peak Flow --      Pain Score 11/10/21 0419 0     Pain Loc --      Pain Edu? --      Excl. in [place]? --     Most recent vital signs: Vitals:   11/10/21 0421 11/10/21 0624  BP: 108/62 129/78  Pulse: (!) 120 (!) 118  Resp: 20 16  Temp: 98.4 F (36.9 C)   SpO2: 97% 97%    General: Awake, no distress.  Appears uncomfortable. CV:  Good peripheral perfusion. Tachycardic and regular   Resp:  Normal effort. CTAB Abd:  No distention. Minimal epigastric TTP, otherwise benign, no peritoneal features MSK:  No deformity noted.  Neuro:  No focal deficits appreciated. Cranial nerves II through XII intact 5/5 strength and  sensation in all 4 extremities Other:     ED Results / Procedures / Treatments   Labs (all labs ordered are listed, but only abnormal results are displayed) Labs Reviewed  CBC WITH DIFFERENTIAL/PLATELET - Abnormal; Notable for the following components:      Result Value   WBC 18.0 (*)    RBC 6.57 (*)    Hemoglobin 18.8 (*)    HCT 54.4 (*)    Platelets 426 (*)    Neutro Abs 15.1 (*)    Monocytes Absolute 1.2 (*)    All other components within normal limits  COMPREHENSIVE METABOLIC PANEL - Abnormal; Notable for the following components:   Glucose, Bld 123 (*)    Total Protein 8.8 (*)    ALT 65 (*)    All other components within normal limits  URINALYSIS, ROUTINE W REFLEX MICROSCOPIC - Abnormal; Notable for the following components:   Color, Urine AMBER (*)    APPearance HAZY (*)    Ketones, ur 5 (*)    Protein, ur 100 (*)    All other components within normal limits  LIPASE, BLOOD  ETHANOL  URINE DRUG SCREEN, QUALITATIVE (  [place])    EKG   RADIOLOGY   Official radiology report(s): No results found.  PROCEDURES and INTERVENTIONS:  Procedures  Medications  lactated ringers bolus 2,000 mL (2,000 mLs Intravenous New Bag/Given 11/10/21 0531)  ondansetron (ZOFRAN) injection 4 mg (4 mg Intravenous Given 11/10/21 0531)     IMPRESSION / MDM / ASSESSMENT AND PLAN / ED COURSE  I reviewed the triage vital signs and the nursing notes.  Pt presents to the ED from home via EMS for recurrent N/V/D causing dehydration. He is tachycardic, but hemodynamically stable without signs of shock. Looks clinically well and has mild  epigastric discomfort on palpation, but no peritoneal features or guarding, and otherwise a benign exam. CBC with elevation of all cell lines, suspicious for dehydration and hemoconcentration in the setting of GI losses. Metabolic panel with marginal elevation of ALT but no signs of biliary obstruction. Normal lipase. Surprisingly, negative ethanol level.  Suppose he could have thrown up all the ethanol prior to significant absorption. Regardless, he is feeling much better with the initiation of fluid resuscitation. If he remains persistently tachycardic, may benefit from CT abd/pelv. I anticipate he'll be suitable for outpatient management. Signed out to oncoming provider to f/u after fluids.   Clinical Course as of 11/10/21 0645  Thu Nov 10, 2021  0556 Reassessed.  Mom now at the bedside.  Patient reports feeling better as fluids are getting started.  Tachycardia is resolved on radial palpation.  Mom reports he seems fine right now.  She was at work this evening and does not know what happened. [DS]  [id number] Reassessed. Feeling better.  [DS]    Clinical Course User Index [DS] Vladimir Crofts, MD     FINAL CLINICAL IMPRESSION(S) / ED DIAGNOSES   Final diagnoses:  Nausea vomiting and diarrhea  Dehydration     Rx / DC Orders   ED Discharge Orders     None        Note:  This document was prepared using Dragon voice recognition software and may include unintentional dictation errors.   Vladimir Crofts, MD 11/10/21 [id number]

## 2021-11-10 NOTE — ED Notes (Signed)
Pt given crackers, peanut butter, and ginger ale.  

## 2022-01-07 ENCOUNTER — Emergency Department
Admission: EM | Admit: 2022-01-07 | Discharge: 2022-01-07 | Disposition: A | Discharge status: Home / Self Care | Payer: Medicaid Other | Attending: Emergency Medicine | Admitting: Emergency Medicine

## 2022-01-07 ENCOUNTER — Encounter: Payer: Self-pay | Admitting: Emergency Medicine

## 2022-01-07 ENCOUNTER — Other Ambulatory Visit: Payer: Self-pay

## 2022-01-07 ENCOUNTER — Emergency Department: Payer: Medicaid Other

## 2022-01-07 DIAGNOSIS — J45909 Unspecified asthma, uncomplicated: Secondary | ICD-10-CM | POA: Insufficient documentation

## 2022-01-07 DIAGNOSIS — F419 Anxiety disorder, unspecified: Secondary | ICD-10-CM | POA: Diagnosis not present

## 2022-01-07 DIAGNOSIS — R079 Chest pain, unspecified: Secondary | ICD-10-CM | POA: Diagnosis present

## 2022-01-07 HISTORY — DX: Anxiety disorder, unspecified: F41.9

## 2022-01-07 LAB — BASIC METABOLIC PANEL
Anion gap: 10 (ref 5–15)
BUN: 11 mg/dL (ref 6–20)
CO2: 26 mmol/L (ref 22–32)
Calcium: 9.9 mg/dL (ref 8.9–10.3)
Chloride: 103 mmol/L (ref 98–111)
Creatinine, Ser: 0.77 mg/dL (ref 0.61–1.24)
GFR, Estimated: >60 mL/min (ref >60)
Glucose, Bld: 111 mg/dL — ABNORMAL HIGH (ref 70–99)
Potassium: 3.7 mmol/L (ref 3.5–5.1)
Sodium: 139 mmol/L (ref 135–145)

## 2022-01-07 LAB — CBC
HCT: 48.2 % (ref 39.0–52.0)
Hemoglobin: 16.6 g/dL (ref 13.0–17.0)
MCH: 28.4 pg (ref 26.0–34.0)
MCHC: 34.4 g/dL (ref 30.0–36.0)
MCV: 82.4 fL (ref 80.0–100.0)
Platelets: 339 10*3/uL (ref 150–400)
RBC: 5.85 MIL/uL — ABNORMAL HIGH (ref 4.22–5.81)
RDW: 12.6 % (ref 11.5–15.5)
WBC: 8.1 10*3/uL (ref 4.0–10.5)
nRBC: 0 % (ref 0.0–0.2)

## 2022-01-07 LAB — TROPONIN I (HIGH SENSITIVITY): Troponin I (High Sensitivity): 3 ng/L (ref <18)

## 2022-01-07 MED ORDER — HYDROXYZINE HCL 25 MG PO TABS
25.0000 mg | ORAL_TABLET | Freq: Once | ORAL | Status: AC
  Administered 2022-01-07: 25 mg via ORAL
  Filled 2022-01-07: qty 1

## 2022-01-07 NOTE — ED Triage Notes (Signed)
First RN Note: pt to ED via POV, ambulatory to desk holding his chest, pt states "I think there is something wrong with my heart". Pt reports bilateral arm numbness, pt also reports L leg numbness. Pt reports several minutes PTA pt took 1 tablet of 0.5mg  Lorazepam. Pt states "I think I took 1, well yeah let's just go with I took 1". Pt endorses L arm pain, and chest pain. Pt appears anxious on arrival to triage desk.  ?

## 2022-01-07 NOTE — ED Provider Notes (Signed)
? ?[facility] ?Provider Note ? ? ? Event Date/Time  ? First MD Initiated Contact with Patient 01/07/22 2004   ?  (approximate) ? ? ?History  ? ?Chest Pain ? ? ?HPI ? ?Gerald Olsen is a 26 y.o. male  who, per GI note dated 05/14/2019 has history of dysphagia, asthma, chest pain, who presents to the emergency department today because of concern for chest pain. Patient states he was walking when it started this evening. The pain is located in the center chest. Associated with palpitations and felt like his fingers were going numb.  Patient states he does have a history of panic attacks and this reminds him of his previous episodes.  He did try taking an Ativan which did not give any significant relief. ? ? ?Physical Exam  ? ?Triage Vital Signs: ?ED Triage Vitals  ?Enc Vitals Group  ?   BP 01/07/22 1949 140/85  ?   Pulse Rate 01/07/22 1949 (!) 133  ?   Resp 01/07/22 1949 20  ?   Temp 01/07/22 1949 98.5 ?F (36.9 ?C)  ?   Temp Source 01/07/22 1949 Oral  ?   SpO2 01/07/22 1949 99 %  ?   Weight 01/07/22 1950 225 lb (102.1 kg)  ?   Height 01/07/22 1950 5\' 8"  (1.727 m)  ?   Head Circumference --   ?   Peak Flow --   ?   Pain Score 01/07/22 1950 5  ? ?Most recent vital signs: ?Vitals:  ? 01/07/22 1949  ?BP: 140/85  ?Pulse: (!) 133  ?Resp: 20  ?Temp: 98.5 ?F (36.9 ?C)  ?SpO2: 99%  ? ? ?General: Awake, no distress.  ?CV:  Good peripheral perfusion. Tachycardia. ?Resp:  Normal effort. Lungs clear to auscultation ?Abd:  No distention.  ?Psych:  Appears anxious. ? ? ?ED Results / Procedures / Treatments  ? ?Labs ?(all labs ordered are listed, but only abnormal results are displayed) ?Labs Reviewed  ?CBC - Abnormal; Notable for the following components:  ?    Result Value  ? RBC 5.85 (*)   ? All other components within normal limits  ?BASIC METABOLIC PANEL  ?TROPONIN I (HIGH SENSITIVITY)  ? ? ? ?EKG ? ?INance Pear, attending physician, personally viewed and interpreted this EKG ? ?EKG Time:  1952 ?Rate: 133 ?Rhythm: sinus tachycardia ?Axis: right axis deviation ?Intervals: qtc 455 ?QRS: incomplete RBBB ?ST changes: no st elevation ?Impression: abnormal ekg ? ? ? ?RADIOLOGY ?I independently interpreted and visualized the CXR. My interpretation: No pneumonia. No pneumothorax. ?Radiology interpretation:  ?IMPRESSION:  ?No active cardiopulmonary disease.  ?   ? ? ? ?PROCEDURES: ? ?Critical Care performed: No ? ?Procedures ? ? ?MEDICATIONS ORDERED IN ED: ?Medications - No data to display ? ? ?IMPRESSION / MDM / ASSESSMENT AND PLAN / ED COURSE  ?I reviewed the triage vital signs and the nursing notes. ?             ?               ? ?Differential diagnosis includes, but is not limited to, pneumonia, pneumothorax, PE, panic attack. ? ?Patient presented to the emergency department today because of concerns for chest pain.  He states it does feel like his previous panic attacks.  Chest x-ray here without any concerning pneumonia or pneumothorax.  Troponin was negative.  EKG without concerning ST elevation.  Patient did feel better after medication here and heart rate did improve.  At  this time I do think panic attack likely.  Discussed this with the patient.  Given clinical improvement and reassuring work-up I do not feel patient necessitates inpatient mission at this time.  Will discharge follow-up with primary care. ? ? ?FINAL CLINICAL IMPRESSION(S) / ED DIAGNOSES  ? ?Final diagnoses:  ?Nonspecific chest pain  ?Anxiety  ? ? ? ? ?Note:  This document was prepared using Dragon voice recognition software and may include unintentional dictation errors. ? ?  ?Nance Pear, MD ?01/07/22 2223 ? ?

## 2022-01-07 NOTE — ED Triage Notes (Signed)
Pt reports he developed chest pain about 15 minutes ago and tingling sensation to his arms. Pt reports took 1 lorazepam 0.5mg  prior to arrival to help with his anxiety. Pt reports some shortness of breath. Pt talks in complete sentences no respiratory distress noted. Pt denies any recreational use of drugs or alcohol intake.  ?

## 2022-01-07 NOTE — Discharge Instructions (Signed)
Please seek medical attention for any high fevers, chest pain, shortness of breath, change in behavior, persistent vomiting, bloody stool or any other new or concerning symptoms.  

## 2022-01-07 NOTE — ED Notes (Signed)
Pt states hx of panic attacks, has .5mg  Ativan for such and took one when palpitations started today. Pt tachypneic at 120 at this time, all other vitals WNL. Pt denies CP at this time. Family at bedside. ?

## 2022-01-07 NOTE — ED Notes (Signed)
RFainbow and UA sent to lab ?

## 2022-02-05 ENCOUNTER — Emergency Department: Payer: Medicaid Other

## 2022-02-05 ENCOUNTER — Other Ambulatory Visit: Payer: Self-pay

## 2022-02-05 ENCOUNTER — Emergency Department
Admission: EM | Admit: 2022-02-05 | Discharge: 2022-02-05 | Disposition: A | Discharge status: Home / Self Care | Payer: Medicaid Other | Attending: Emergency Medicine | Admitting: Emergency Medicine

## 2022-02-05 DIAGNOSIS — F419 Anxiety disorder, unspecified: Secondary | ICD-10-CM

## 2022-02-05 DIAGNOSIS — R42 Dizziness and giddiness: Secondary | ICD-10-CM | POA: Insufficient documentation

## 2022-02-05 DIAGNOSIS — R000 Tachycardia, unspecified: Secondary | ICD-10-CM | POA: Diagnosis not present

## 2022-02-05 DIAGNOSIS — R1310 Dysphagia, unspecified: Secondary | ICD-10-CM | POA: Diagnosis not present

## 2022-02-05 DIAGNOSIS — J45909 Unspecified asthma, uncomplicated: Secondary | ICD-10-CM | POA: Insufficient documentation

## 2022-02-05 DIAGNOSIS — H538 Other visual disturbances: Secondary | ICD-10-CM | POA: Insufficient documentation

## 2022-02-05 LAB — URINALYSIS, ROUTINE W REFLEX MICROSCOPIC
Bilirubin Urine: NEGATIVE
Glucose, UA: NEGATIVE mg/dL
Hgb urine dipstick: NEGATIVE
Ketones, ur: NEGATIVE mg/dL
Leukocytes,Ua: NEGATIVE
Nitrite: NEGATIVE
Protein, ur: NEGATIVE mg/dL
Specific Gravity, Urine: 1.019 (ref 1.005–1.030)
pH: 5 (ref 5.0–8.0)

## 2022-02-05 LAB — CBG MONITORING, ED: Glucose-Capillary: 79 mg/dL (ref 70–99)

## 2022-02-05 MED ORDER — ONDANSETRON 4 MG PO TBDP: 4.0000 mg | ORAL_TABLET | Freq: Once | ORAL | Status: DC

## 2022-02-05 NOTE — Discharge Instructions (Signed)
Your blood sugar EKG chest x-ray and urine sample as well as your vision test were all reassuring today.  Please follow-up with your primary care provider for routine blood work screening.  Please also follow-up with [facility] for management of your anxiety. ?

## 2022-02-05 NOTE — ED Triage Notes (Signed)
Pt to ED for feelings of anxiousness and feeling dizzy. Pt has fast speech, states has been hardly sleeping or eating for 6 days. Pt takes buspirone, last time was 6 hours ago. Also takes fluoxamine 100mg  per day.  ? ?Pt states he self administered 2 81mg  aspirin this morning, about 6 hours ago, because pt was reading on internet about symptoms and thought it could be heart attack or stroke. States was feeling "pricking" on R arm and "pressure" in L arm this morning. ? ?States that all symptoms relating to anxiety started a couple of months ago after smoking a joint that was laced with fentanyl. States did not sleep at all last night. ? ?Pt uses she/her pronouns and goes by name Gerald Olsen. Pt identifies as transgender woman. Will notify registration to add to chart. ?

## 2022-02-05 NOTE — ED Notes (Signed)
Patient ambulated to hallway commode with a steady gait. ?

## 2022-02-05 NOTE — ED Provider Notes (Addendum)
? ?[facility] ?Provider Note ? ? ? Event Date/Time  ? First MD Initiated Contact with Patient 02/05/22 1209   ?  (approximate) ? ? ?History  ? ?Dizziness and Anxiety ? ? ?HPI ? ?Gerald Olsen is a 27 y.o. adult past medical history of ADHD, Tourette's syndrome OCD presents with multiple complaints.  He endorses over the last 4 days he has had difficulty swallowing.  He is able to keep liquids down with effort, but solids have been coming back up.  Denies sore throat or odynophagia.  No oral ulcers.  Says that he had similar several years ago and saw ENT with the scope and ultimately told it was anxiety.  Patient also notes that he has had intermittent cold feeling in his right arm sometimes right side of his face feels like his eyelids are twitching.  Denies visual change or weakness.  Denies chest pain but does have some intermittent dizziness.  No dyspnea.  Patient has had no cough no fevers at home abdominal pain vomiting or diarrhea.  Patient notes that he had a bad trip with fentanyl several months ago and since that time has had significant anxiety and what he describes as depersonalization.  He is on buspirone for anxiety prescribed by his primary care doctor but does not have a psychiatrist.  Denies suicidal ideation currently. ?  ? ?Past Medical History:  ?Diagnosis Date  ? ADHD   ? Anxiety   ? Asthma   ? Chest pain   ? Tourette's   ? ? ?Patient Active Problem List  ? Diagnosis Date Noted  ? Depression 04/15/2019  ? Chest pain 04/09/2013  ? ? ? ?Physical Exam  ?Triage Vital Signs: ?ED Triage Vitals  ?Enc Vitals Group  ?   BP 02/05/22 1155 (!) 129/114  ?   Pulse Rate 02/05/22 1155 (!) 105  ?   Resp 02/05/22 1155 20  ?   Temp 02/05/22 1155 100.2 ?F (37.9 ?C)  ?   Temp Source 02/05/22 1155 Oral  ?   SpO2 02/05/22 1155 96 %  ?   Weight 02/05/22 1157 216 lb 7.9 oz (98.2 kg)  ?   Height 02/05/22 1157 5\' 8"  (1.727 m)  ?   Head Circumference --   ?   Peak Flow --   ?   Pain Score 02/05/22  1157 0  ?   Pain Loc --   ?   Pain Edu? --   ?   Excl. in [place]? --   ? ? ?Most recent vital signs: ?Vitals:  ? 02/05/22 1155 02/05/22 1339  ?BP: (!) 129/114 121/75  ?Pulse: (!) 105 81  ?Resp: 20 18  ?Temp: 100.2 ?F (37.9 ?C)   ?SpO2: 96% 98%  ? ? ? ?General: Awake, patient appears anxious, speech is rapid ?CV:  Good peripheral perfusion.  ?Resp:  Normal effort.  ?Abd:  No distention.  Soft and nontender throughout ?Neuro:             Awake, Alert, Oriented x 3  ?Other:  Patient with frequent throat clearing tic, speech is pressured, but he is redirectable no suicidal ideation ?Aox3, nml speech  ?PERRL, EOMI, face symmetric, nml tongue movement  ?5/5 strength in the BL upper and lower extremities  ?Sensation grossly intact in the BL upper and lower extremities  ?Finger-nose-finger intact BL ? ?Nml posterior oropharynx, no oral lesions ? ? ?ED Results / Procedures / Treatments  ?Labs ?(all labs ordered are listed, but only  abnormal results are displayed) ?Labs Reviewed  ?URINALYSIS, ROUTINE W REFLEX MICROSCOPIC - Abnormal; Notable for the following components:  ?    Result Value  ? Color, Urine YELLOW (*)   ? APPearance CLEAR (*)   ? All other components within normal limits  ?CBG MONITORING, ED  ? ? ? ?EKG ? ?EKG interpretation performed by myself: NSR, nml axis, nml intervals, no acute ischemic changes ? ? ? ?RADIOLOGY ?I reviewed the CXR which does not show any acute cardiopulmonary process; agree with radiology report  ? ? ? ?PROCEDURES: ? ?Critical Care performed: No ? ?Procedures ? ? ?MEDICATIONS ORDERED IN ED: ?Medications  ?ondansetron (ZOFRAN-ODT) disintegrating tablet 4 mg (4 mg Oral Not Given 02/05/22 1257)  ? ? ? ?IMPRESSION / MDM / ASSESSMENT AND PLAN / ED COURSE  ?I reviewed the triage vital signs and the nursing notes. ?             ?               ? ?Differential diagnosis includes, but is not limited to, anxiety, achalasia, esophageal webs/spasm, electrolyte abnormality ? ?Patient is a 26 year old male  who presents with multiple complaints today.  He seems to be having a great deal of anxiety that there is something wrong with him that is going to kill him.  Endorses difficulty swallowing solids particularly but no odynophagia and tolerating liquids similar in the past and was told it was due to anxiety.  Also with intermittent paresthesias and cold like sensation in the right hand eye twitching.  Patient requesting CT of the head because concerned about clots or tumor as well as requesting a rainbow of labs because concerned that something is going to kill him.  He appears quite anxious and has pressured speech and tics but is not suicidal and is overall redirectable.  Does have a low-grade temp of 100.2 was mildly tachycardic.  Overall appears well on exam with a nonfocal neurologic exam benign abdomen clear lungs.  Gerald obtain chest x-ray urine and glucose as well as an EKG.  Discussed that CT and labs are of little utility in this case and Gerald not screen for blood clots or cancer necessarily.  Patient ultimately did not want CT after we discussed the radiation risk.  Ultimately I suspect symptoms are related to anxiety and Gerald refer to psychiatry. ? ?EKG is within normal limits chest x-ray is negative UA without evidence of infection and blood sugar is normal.  On reassessment patient asking for blood work and complaining of some blurred vision and for visual field.  Denies diplopia and again his neurologic exam is normal as bilaterally has no eye pain.  Gerald check visual acuity. ?  ?Patient's visual acuity is 20/25 bilaterally.  Gerald refer to primary care for routine health maintenance as well as [facility] for management of anxiety.  Also refer to ENT and GI if there is ongoing issues with swallowing. ? ?FINAL CLINICAL IMPRESSION(S) / ED DIAGNOSES  ? ?Final diagnoses:  ?Anxiety  ?Dizziness  ? ? ? ?Rx / DC Orders  ? ?ED Discharge Orders   ? ? None  ? ?  ? ? ? ?Note:  This document was prepared using Dragon voice  recognition software and may include unintentional dictation errors. ?  ?Georga Hacking, MD ?02/05/22 1419 ? ?  ?Georga Hacking, MD ?02/05/22 1420 ? ?

## 2022-02-08 ENCOUNTER — Emergency Department
Admission: EM | Admit: 2022-02-08 | Discharge: 2022-02-08 | Disposition: A | Discharge status: Home / Self Care | Payer: Medicaid Other | Attending: Emergency Medicine | Admitting: Emergency Medicine

## 2022-02-08 ENCOUNTER — Emergency Department: Payer: Medicaid Other

## 2022-02-08 ENCOUNTER — Other Ambulatory Visit: Payer: Self-pay

## 2022-02-08 DIAGNOSIS — X58XXXA Exposure to other specified factors, initial encounter: Secondary | ICD-10-CM | POA: Insufficient documentation

## 2022-02-08 DIAGNOSIS — T17308A Unspecified foreign body in larynx causing other injury, initial encounter: Secondary | ICD-10-CM

## 2022-02-08 DIAGNOSIS — T17920A Food in respiratory tract, part unspecified causing asphyxiation, initial encounter: Secondary | ICD-10-CM | POA: Diagnosis present

## 2022-02-08 MED ORDER — PANTOPRAZOLE SODIUM 20 MG PO TBEC: 20.0000 mg | DELAYED_RELEASE_TABLET | Freq: Every day | ORAL | 0 refills | Status: DC

## 2022-02-08 NOTE — ED Triage Notes (Addendum)
Pt comes ems from home after choking episode. Thinks some grits got stuck in throat. VSS. CBG 126. Able to talk in complete sentences. Has a lingering cough.  ?

## 2022-02-08 NOTE — ED Provider Notes (Signed)
? ?[name] ?Provider Note ? ? ? Event Date/Time  ? First MD Initiated Contact with Patient 02/08/22 1029   ?  (approximate) ? ? ?History  ? ?Choking ? ? ?HPI ? ?Gerald Olsen is a 26 y.o. adult with ADHD, Tourette's syndrome, OCD who comes in with concern for feeling like patient choked on grits.  Patient reports about 2 hours prior to arrival that patient had a choking episode while trying to swallow grits.  Patient reports concerned that they could have aspirated some of it.  Reports continued difficulty swallowing and feeling like something is stuck in the throat.  Patient does report a history of anxiety and but being told previously after ENT scoped that it was most likely just anxiety causing the symptoms. ? ? ?Physical Exam  ? ?Triage Vital Signs: ?ED Triage Vitals  ?Enc Vitals Group  ?   BP 02/08/22 1028 (!) 128/106  ?   Pulse Rate 02/08/22 1028 94  ?   Resp 02/08/22 1028 18  ?   Temp 02/08/22 1028 98.7 ?F (37.1 ?C)  ?   Temp Source 02/08/22 1028 Oral  ?   SpO2 02/08/22 1028 96 %  ?   Weight 02/08/22 1032 216 lb 7.9 oz (98.2 kg)  ?   Height 02/08/22 1032 5\' 8"  (1.727 m)  ?   Head Circumference --   ?   Peak Flow --   ?   Pain Score 02/08/22 1026 0  ?   Pain Loc --   ?   Pain Edu? --   ?   Excl. in [place]? --   ? ? ?Most recent vital signs: ?Vitals:  ? 02/08/22 1028  ?BP: (!) 128/106  ?Pulse: 94  ?Resp: 18  ?Temp: 98.7 ?F (37.1 ?C)  ?SpO2: 96%  ? ? ? ?General: Awake, no distress.  Patient appears anxious ?CV:  Good peripheral perfusion.  ?Resp:  Normal effort.  ?Abd:  No distention.  ?Other:  Patient with frequent throat clearing with a tic denies any SI  ?Cranial nerves II through XII are intact.  Equal strength in arms and legs.  Sensation intact ? ?ED Results / Procedures / Treatments  ? ?Labs ?(all labs ordered are listed, but only abnormal results are displayed) ?Labs Reviewed - No data to display ? ? ?RADIOLOGY ?I have reviewed the xray and interpreted personally and no evidence  of aspiration/pneumonia  ? ? ?PROCEDURES: ? ?Critical Care performed: No ? ?Procedures ? ? ?MEDICATIONS ORDERED IN ED: ?Medications - No data to display ? ? ?IMPRESSION / MDM / ASSESSMENT AND PLAN / ED COURSE  ?I reviewed the triage vital signs and the nursing notes. ? ?Patient comes in with concern for swallowing grits and having some difficulty swallowing afterwards with concerns for difficulty swallowing.  X-ray ordered from triage to look for any evidence of aspiration.  Discussed with patient the only way to look for foreign body is endoscopy but how that would only be indicated patient is not able to tolerate p.o.  Patient has tolerated drinking some applesauce and drinking some apple juice without any vomiting.  At this time I very low suspicion for esophageal obstruction.  She does report a lot of irritation of her esophagus and I wonder if she could have a little esophagitis versus some acid reflux given she reports a lot of coughing.  We will start her on acid reducer.  She reports still being able to take her psychiatric medications and denies any  SI.  Reports following up with a counselor later today.  No indication for IVC at this time. ? ?I reviewed patient's follow-up with Dr. Allegra Lai on 05/14/2019 for dysphagia where they did recommend EGD but unclear if patient ever got it ? ?I reviewed patient's blood work from 3 days ago where patient had normal glucose.  Patient's vital signs are otherwise reassuring without any evidence of tachycardia or severe dehydration.  We discussed doing a soft diet until getting follow-up with GI.  Will start on PPI in case this could be some esophagitis or acid reflux ? ? ?  ? ? ?FINAL CLINICAL IMPRESSION(S) / ED DIAGNOSES  ? ?Final diagnoses:  ?Choking, initial encounter  ? ? ? ?Rx / DC Orders  ? ?ED Discharge Orders   ? ?      Ordered  ?  pantoprazole (PROTONIX) 20 MG tablet  Daily       ? 02/08/22 1135  ? ?  ?  ? ?  ? ? ? ?Note:  This document was prepared using Dragon  voice recognition software and may include unintentional dictation errors. ?  ?Concha Se, MD ?02/08/22 1137 ? ?

## 2022-02-08 NOTE — Discharge Instructions (Addendum)
Please call the GI doctor to make a follow-up appointment.  Appears that when they saw you last time they were discussing endoscopy.  Until you get follow-up please do a liquid diet with things like protein shakes to stay well hydrated as well.  ? ?Return for fevers or any other concerns.  ?

## 2022-02-11 ENCOUNTER — Encounter: Payer: Self-pay | Admitting: Emergency Medicine

## 2022-02-11 ENCOUNTER — Emergency Department
Admission: EM | Admit: 2022-02-11 | Discharge: 2022-02-11 | Disposition: A | Discharge status: Home / Self Care | Payer: Medicaid Other | Attending: Emergency Medicine | Admitting: Emergency Medicine

## 2022-02-11 ENCOUNTER — Other Ambulatory Visit: Payer: Self-pay

## 2022-02-11 DIAGNOSIS — R0989 Other specified symptoms and signs involving the circulatory and respiratory systems: Secondary | ICD-10-CM | POA: Insufficient documentation

## 2022-02-11 DIAGNOSIS — T17308D Unspecified foreign body in larynx causing other injury, subsequent encounter: Secondary | ICD-10-CM

## 2022-02-11 MED ORDER — CHLORASEPTIC MOUTH PAIN 1.4 % MT LIQD: 1.0000 | OROMUCOSAL | 0 refills | Status: AC | PRN

## 2022-02-11 NOTE — ED Notes (Signed)
Pt provided ED sandals and phone to call ride.

## 2022-02-11 NOTE — ED Triage Notes (Addendum)
Pt via POV from home. Pt was seen 3 days ago for same. States that feels like something is stuck in his throat. States that he thinks it may be a finger nail, but states he is on a soft and liquid diet and thinks it may be some type of liquid he drank. States they gave him a referral 3 days ago with GI but they could not see him till September. Pt speaking and swallowing own saliva without any difficulty. Pt has a hx of anxiety and is anxious on arrival. Pt is A&OX4 and NAD

## 2022-02-11 NOTE — ED Provider Notes (Signed)
[facility] Provider Note   Event Date/Time   First MD Initiated Contact with Patient 02/11/22 1133     (approximate) History  Dysphagia  HPI LANGSTON METTER is a 26 y.o. adult with a past medical history of ADHD, Tourette's syndrome, and OCD who presents for the second time in 3 days complaining of foreign body sensation to his throat.  Patient states that he believes he choked on crickets approximately 2 weeks ago and has been feeling a sensation of scratching as well as "lump in my throat" for the last 2 weeks.  Patient states that he called EMS today as he had a choking episode while trying to take a drink of water and is concerned that he is having aspiration episodes.  Patient denies any fevers, food/drink out of the ordinary, difficulty handling his secretions, or change in medications.  Patient states that he was seen in the past by Dr. Marius Ditch in GI for similar symptoms however he was unable to get an endoscopy at that time due to lack of insurance and monetary constraints. Physical Exam  Triage Vital Signs: ED Triage Vitals  Enc Vitals Group     BP 02/11/22 1111 (!) 178/89     Pulse Rate 02/11/22 1111 81     Resp 02/11/22 1111 20     Temp 02/11/22 1111 98.3 F (36.8 C)     Temp Source 02/11/22 1111 Oral     SpO2 02/11/22 1111 98 %     Weight --      Height 02/11/22 1109 5\' 8"  (1.727 m)     Head Circumference --      Peak Flow --      Pain Score 02/11/22 1109 8     Pain Loc --      Pain Edu? --      Excl. in [place]? --    Most recent vital signs: Vitals:   02/11/22 1111  BP: (!) 178/89  Pulse: 81  Resp: 20  Temp: 98.3 F (36.8 C)  SpO2: 98%   General: Awake, oriented x4. CV:  Good peripheral perfusion.  Resp:  Normal effort.  Abd:  No distention.  Other:  Young adult transgender male sitting in bed in no acute distress with occasional tics including head-nodding and throat clearing.  Patient currently denies any SI Bedside laryngoscopy with  video laryngoscope guided by patient did not show any evidence of foreign body in the upper oropharynx however visualization of the larynx was limited due to patient discomfort ED Results / Procedures / Treatments  PROCEDURES: Critical Care performed: No Procedures MEDICATIONS ORDERED IN ED: Medications - No data to display IMPRESSION / MDM / [place] / ED COURSE  I reviewed the triage vital signs and the nursing notes.                             Differential diagnosis includes, but is not limited to, esophageal/tracheal foreign body, globus sensation, compulsion, postnasal drip, GERD, esophagitis  Patient is a 26 year old transgender male with the above-stated past medical history who presents for the second time in 3 days with a foreign body sensation to the upper throat.  Patient states that it is a lumpy feeling however does have the sensation occasionally that it is sharp or scratching.   Patient was able to perform a self-guided video laryngoscopy that did not show any evidence of foreign body and seems to assuage  some of patient's concerns however she continues to express significant anxiety over the sensation and repeatedly asks "is this going to kill me".  Patient is concerned as he has had several episodes of choking on solids and liquids and believes he may be having aspiration episodes.  Chest x-ray from previous visit on 02/08/2022 did not show any evidence of of aspiration There may still be an element of reflux and/or postnasal drip as far as chronic irritation however significant life-threatening diagnoses are much less likely at this point including foreign body or significant infection.  Patient is encouraged to follow-up with Dr. Marius Ditch in GI as soon as possible for an endoscopy now that he is insured and has the finances to complete it.  In the meantime patient was encouraged to try symptomatic control with Chloraseptic spray as well as continue Protonix.  Dispo:  Discharge home with GI follow-up   FINAL CLINICAL IMPRESSION(S) / ED DIAGNOSES   Final diagnoses:  Choking, subsequent encounter  Foreign body sensation in throat   Rx / DC Orders   ED Discharge Orders          Ordered    phenol (CHLORASEPTIC MOUTH PAIN) 1.4 % LIQD  As needed        02/11/22 1209           Note:  This document was prepared using Dragon voice recognition software and may include unintentional dictation errors.   Naaman Plummer, MD 02/11/22 [phone]

## 2022-02-11 NOTE — ED Notes (Signed)
See triage note. Pt alert, calm, resp reg/unlabored, skin dry, in NAD.

## 2022-02-13 ENCOUNTER — Emergency Department: Payer: Medicaid Other

## 2022-02-13 ENCOUNTER — Emergency Department
Admission: EM | Admit: 2022-02-13 | Discharge: 2022-02-13 | Disposition: A | Discharge status: Home / Self Care | Payer: Medicaid Other | Attending: Emergency Medicine | Admitting: Emergency Medicine

## 2022-02-13 ENCOUNTER — Other Ambulatory Visit: Payer: Self-pay

## 2022-02-13 DIAGNOSIS — R202 Paresthesia of skin: Secondary | ICD-10-CM | POA: Insufficient documentation

## 2022-02-13 DIAGNOSIS — J45909 Unspecified asthma, uncomplicated: Secondary | ICD-10-CM | POA: Diagnosis not present

## 2022-02-13 DIAGNOSIS — F959 Tic disorder, unspecified: Secondary | ICD-10-CM | POA: Insufficient documentation

## 2022-02-13 DIAGNOSIS — R200 Anesthesia of skin: Secondary | ICD-10-CM | POA: Diagnosis not present

## 2022-02-13 LAB — DIFFERENTIAL
Abs Immature Granulocytes: 0.02 10*3/uL (ref 0.00–0.07)
Basophils Absolute: 0 10*3/uL (ref 0.0–0.1)
Basophils Relative: 0 %
Eosinophils Absolute: 0.3 10*3/uL (ref 0.0–0.5)
Eosinophils Relative: 5 %
Immature Granulocytes: 0 %
Lymphocytes Relative: 34 %
Lymphs Abs: 2.3 10*3/uL (ref 0.7–4.0)
Monocytes Absolute: 0.5 10*3/uL (ref 0.1–1.0)
Monocytes Relative: 8 %
Neutro Abs: 3.6 10*3/uL (ref 1.7–7.7)
Neutrophils Relative %: 53 %

## 2022-02-13 LAB — COMPREHENSIVE METABOLIC PANEL
ALT: 46 U/L — ABNORMAL HIGH (ref 0–44)
AST: 27 U/L (ref 15–41)
Albumin: 5.1 g/dL — ABNORMAL HIGH (ref 3.5–5.0)
Alkaline Phosphatase: 76 U/L (ref 38–126)
Anion gap: 8 (ref 5–15)
BUN: 13 mg/dL (ref 6–20)
CO2: 26 mmol/L (ref 22–32)
Calcium: 9.9 mg/dL (ref 8.9–10.3)
Chloride: 106 mmol/L (ref 98–111)
Creatinine, Ser: 0.85 mg/dL (ref 0.61–1.24)
GFR, Estimated: >60 mL/min (ref >60)
Glucose, Bld: 95 mg/dL (ref 70–99)
Potassium: 4.1 mmol/L (ref 3.5–5.1)
Sodium: 140 mmol/L (ref 135–145)
Total Bilirubin: 2 mg/dL — ABNORMAL HIGH (ref 0.3–1.2)
Total Protein: 8.2 g/dL — ABNORMAL HIGH (ref 6.5–8.1)

## 2022-02-13 LAB — PROTIME-INR
INR: 1.1 (ref 0.8–1.2)
Prothrombin Time: 14.5 seconds (ref 11.4–15.2)

## 2022-02-13 LAB — CBC
HCT: 49.7 % (ref 39.0–52.0)
Hemoglobin: 17.2 g/dL — ABNORMAL HIGH (ref 13.0–17.0)
MCH: 28.3 pg (ref 26.0–34.0)
MCHC: 34.6 g/dL (ref 30.0–36.0)
MCV: 81.7 fL (ref 80.0–100.0)
Platelets: 355 10*3/uL (ref 150–400)
RBC: 6.08 MIL/uL — ABNORMAL HIGH (ref 4.22–5.81)
RDW: 12.5 % (ref 11.5–15.5)
WBC: 6.8 10*3/uL (ref 4.0–10.5)
nRBC: 0 % (ref 0.0–0.2)

## 2022-02-13 LAB — APTT: aPTT: 31 seconds (ref 24–36)

## 2022-02-13 MED ORDER — SODIUM CHLORIDE 0.9% FLUSH: 3.0000 mL | Freq: Once | INTRAVENOUS | Status: DC

## 2022-02-13 NOTE — ED Triage Notes (Addendum)
Pt comes via EMs with c/o tongue tingling that started week ago on and off. Pt also states left side of face numbness that started week ago.  VSS CBG-91  Pt states bilareral eye vision loss that alternates and it also started week ago.

## 2022-02-13 NOTE — ED Provider Notes (Signed)
[facility] Provider Note    Event Date/Time   First MD Initiated Contact with Patient 02/13/22 1233     (approximate)   History   Chief Complaint tongue tingling   HPI  Gerald Olsen is a 26 y.o. male who identifies as male with past medical history of asthma, anxiety, and OCD who presents to the ED complaining of tingling.  Patient reports that he has been dealing with intermittent numb and tingly sensation to both sides of his tongue for the past couple of weeks.  Symptoms can come on at any time, or not exacerbated or alleviated by anything in particular.  He states that he will occasionally have numbness and abnormal sensation on the left side of his face, but he has never noticed facial drooping.  He denies any numbness or weakness in his extremities.  He does state at times he has had blurriness affecting his entire visual field in both eyes, left greater than right.  He has been seen in the ED on multiple occasions for difficulty swallowing and globus sensation with unremarkable work-up.  Symptoms of tongue numbness and tingling occurred again today around 9 AM.  Patient reports that 4 days ago he stopped taking his fluvoxamine due to the sensations in his throat.     Physical Exam   Triage Vital Signs: ED Triage Vitals  Enc Vitals Group     BP 02/13/22 1121 128/71     Pulse Rate 02/13/22 1121 97     Resp 02/13/22 1121 18     Temp 02/13/22 1121 98.6 F (37 C)     Temp Source 02/13/22 1121 Oral     SpO2 02/13/22 1121 95 %     Weight 02/13/22 1229 216 lb 7.9 oz (98.2 kg)     Height 02/13/22 1229 5\' 8"  (1.727 m)     Head Circumference --      Peak Flow --      Pain Score 02/13/22 1113 3     Pain Loc --      Pain Edu? --      Excl. in [place]? --     Most recent vital signs: Vitals:   02/13/22 1121 02/13/22 1334  BP: 128/71 124/76  Pulse: 97 75  Resp: 18 18  Temp: 98.6 F (37 C) 98.4 F (36.9 C)  SpO2: 95% 98%    Constitutional:  Alert and oriented. Eyes: Conjunctivae are normal. Head: Atraumatic. Nose: No congestion/rhinnorhea. Mouth/Throat: Mucous membranes are moist.  Tongue midline. Cardiovascular: Normal rate, regular rhythm. Grossly normal heart sounds.  2+ radial pulses bilaterally. Respiratory: Normal respiratory effort.  No retractions. Lungs CTAB. Gastrointestinal: Soft and nontender. No distention. Musculoskeletal: No lower extremity tenderness nor edema.  Neurologic:  Normal speech and language. No gross focal neurologic deficits are appreciated.  Occasional tics noted with movement of head.    ED Results / Procedures / Treatments   Labs (all labs ordered are listed, but only abnormal results are displayed) Labs Reviewed  CBC - Abnormal; Notable for the following components:      Result Value   RBC 6.08 (*)    Hemoglobin 17.2 (*)    All other components within normal limits  COMPREHENSIVE METABOLIC PANEL - Abnormal; Notable for the following components:   Total Protein 8.2 (*)    Albumin 5.1 (*)    ALT 46 (*)    Total Bilirubin 2.0 (*)    All other components within normal limits  PROTIME-INR  APTT  DIFFERENTIAL  I-STAT CREATININE, ED  CBG MONITORING, ED   RADIOLOGY CT head reviewed and interpreted by me with no hemorrhage or midline shift.  PROCEDURES:  Critical Care performed: No  Procedures   MEDICATIONS ORDERED IN ED: Medications  sodium chloride flush (NS) 0.9 % injection 3 mL (3 mLs Intravenous Not Given 02/13/22 1220)     IMPRESSION / MDM / ASSESSMENT AND PLAN / ED COURSE  I reviewed the triage vital signs and the nursing notes.                              26 y.o. adult with past medical history of asthma, anxiety, and OCD who presents to the ED complaining of intermittent episodes of tongue tingling, facial numbness, and blurry vision occurring over the past couple of weeks that started again today at 9 AM.  Differential diagnosis includes, but is not limited to,  stroke, TIA, paresthesia, electrolyte abnormality, SSRI withdrawal.  Patient well-appearing and in no acute distress, states all symptoms have resolved at the time of my evaluation.  He has occasional tics of his head attributable to prior diagnosis of Tourette's, no focal neurologic deficits noted on exam.  CT imaging of his head is unremarkable and I doubt stroke or TIA given he is young and healthy with no risk factors.  Symptoms also do not seem to fit a specific neurologic distribution.  Labs are reassuring, CBC shows no anemia or leukocytosis, BMP without electrolyte abnormality or AKI, LFTs show mild elevation in bilirubin but otherwise unremarkable.  Patient may be developing symptoms of SSRI withdrawal given he acutely stopped his fluvoxamine 4 days ago.  He was counseled to restart this medication as he is able to tolerate it since he states it seemed to help him in the past.  He was counseled to further discuss ongoing SSRI treatment with his PCP, by whom it was prescribed.  He was provided with follow-up with neurology, counseled to return to the ED for new or worsening symptoms.  Patient agrees with plan.      FINAL CLINICAL IMPRESSION(S) / ED DIAGNOSES   Final diagnoses:  Paresthesia     Rx / DC Orders   ED Discharge Orders     None        Note:  This document was prepared using Dragon voice recognition software and may include unintentional dictation errors.   Chesley Noon, MD 02/13/22 1401

## 2022-02-13 NOTE — ED Notes (Signed)
Pt reports that he has been having left sided facial numbness with his tongue feeling numb x 1 week. Pt also reports that he has been feeling like he was going to choke. Passed [facility] swallow screen. [facility] 0

## 2022-02-16 ENCOUNTER — Other Ambulatory Visit: Payer: Self-pay

## 2022-02-16 ENCOUNTER — Emergency Department
Admission: EM | Admit: 2022-02-16 | Discharge: 2022-02-16 | Disposition: A | Discharge status: Home / Self Care | Payer: Medicaid Other | Attending: Emergency Medicine | Admitting: Emergency Medicine

## 2022-02-16 DIAGNOSIS — F419 Anxiety disorder, unspecified: Secondary | ICD-10-CM | POA: Diagnosis not present

## 2022-02-16 DIAGNOSIS — R449 Unspecified symptoms and signs involving general sensations and perceptions: Secondary | ICD-10-CM | POA: Diagnosis present

## 2022-02-16 DIAGNOSIS — J45909 Unspecified asthma, uncomplicated: Secondary | ICD-10-CM | POA: Insufficient documentation

## 2022-02-16 DIAGNOSIS — R0989 Other specified symptoms and signs involving the circulatory and respiratory systems: Secondary | ICD-10-CM

## 2022-02-16 NOTE — Discharge Instructions (Signed)
Please take your Protonix as prescribed.  Also take the Zyrtec 10mg  daily.  Follow up with GI as well as [facility]

## 2022-02-16 NOTE — ED Notes (Signed)
See triage note  presents after having a choking episode this am   states he has been seen for same couple of times  sxs' are worse in the am

## 2022-02-16 NOTE — ED Triage Notes (Signed)
Pt comes into the ED via EMS from home after choking on a protein shake, states it felt like it went down the wrong way and now feels "wheezing and gurgling" pt is in NAD. Ambulatory with no labored breathing noted at this time. States she has issues with solid foods and swallowing.

## 2022-02-16 NOTE — ED Provider Notes (Signed)
[facility] Provider Note    Event Date/Time   First MD Initiated Contact with Patient 02/16/22 1319     (approximate)   History   Aspiration   HPI  Gerald Olsen is a 26 y.o. adult with history of disassociative self identity disorder, OCD, asthma and as listed in EMR presents to the emergency department for treatment and evaluation after an episode this morning where he drink of the protein shake did not feel as if it went down correctly.  There was noted wheezing and gurgling which precipitated a coughing episode.  Patient feels as though this affected the vocal cords.  Symptoms have since resolved.  Patient has not taken Protonix for the past 2 nights due to awakening with mucus in the back of the throat. Patient currently anxious and concerned for aspiration and states that the nearest GI appointment is in July.      Physical Exam   Triage Vital Signs: ED Triage Vitals  Enc Vitals Group     BP 02/16/22 1305 130/81     Pulse Rate 02/16/22 1305 86     Resp 02/16/22 1305 17     Temp 02/16/22 1305 98.3 F (36.8 C)     Temp Source 02/16/22 1305 Oral     SpO2 02/16/22 1305 97 %     Weight 02/16/22 1307 210 lb (95.3 kg)     Height 02/16/22 1307 5\' 7"  (1.702 m)     Head Circumference --      Peak Flow --      Pain Score 02/16/22 1307 0     Pain Loc --      Pain Edu? --      Excl. in [place]? --     Most recent vital signs: Vitals:   02/16/22 1305  BP: 130/81  Pulse: 86  Resp: 17  Temp: 98.3 F (36.8 C)  SpO2: 97%    General: Awake, no distress. CV:  Good peripheral perfusion.  Resp:  Normal effort. Breath sounds clear throughout. Abd:  No distention.  Other:  TM normal bilaterally, airway is patent, uvula is midline, no difficulty swallowing   ED Results / Procedures / Treatments   Labs (all labs ordered are listed, but only abnormal results are displayed) Labs Reviewed - No data to display   EKG  Not  indicated.   RADIOLOGY  Image and radiology report reviewed by me.  Not indicated.  PROCEDURES:  Critical Care performed: No  Procedures   MEDICATIONS ORDERED IN ED: Medications - No data to display   IMPRESSION / MDM / [place] / ED COURSE   I have reviewed the triage note.  Differential diagnosis includes, but is not limited to: functional disorder; aspiration; airway obstruction  26 year old patient presenting to the emergency department for concern of aspiration.  See HPI for further details.  Exam was completely reassuring.  Patient was strongly advised to take the protonix as prescribed. Zyrtec also encouraged.   Patient inquired about [facility] and information will be provided in discharge paperwork. Calling for an earlier appointment with GI was also recommended.      FINAL CLINICAL IMPRESSION(S) / ED DIAGNOSES   Final diagnoses:  Globus sensation  Anxiety     Rx / DC Orders   ED Discharge Orders     None        Note:  This document was prepared using Dragon voice recognition software and may include unintentional dictation errors.  Victorino Dike, FNP 02/16/22 1422    Blake Divine, MD 02/16/22 1659

## 2022-02-28 ENCOUNTER — Emergency Department
Admission: EM | Admit: 2022-02-28 | Discharge: 2022-02-28 | Disposition: A | Discharge status: Home / Self Care | Payer: Medicaid Other | Attending: Emergency Medicine | Admitting: Emergency Medicine

## 2022-02-28 ENCOUNTER — Other Ambulatory Visit: Payer: Self-pay

## 2022-02-28 ENCOUNTER — Encounter: Payer: Self-pay | Admitting: Emergency Medicine

## 2022-02-28 ENCOUNTER — Emergency Department: Payer: Medicaid Other

## 2022-02-28 DIAGNOSIS — Z20822 Contact with and (suspected) exposure to covid-19: Secondary | ICD-10-CM | POA: Diagnosis not present

## 2022-02-28 DIAGNOSIS — R059 Cough, unspecified: Secondary | ICD-10-CM | POA: Diagnosis present

## 2022-02-28 DIAGNOSIS — J40 Bronchitis, not specified as acute or chronic: Secondary | ICD-10-CM | POA: Insufficient documentation

## 2022-02-28 DIAGNOSIS — J45909 Unspecified asthma, uncomplicated: Secondary | ICD-10-CM | POA: Diagnosis not present

## 2022-02-28 DIAGNOSIS — B349 Viral infection, unspecified: Secondary | ICD-10-CM | POA: Insufficient documentation

## 2022-02-28 DIAGNOSIS — R002 Palpitations: Secondary | ICD-10-CM | POA: Diagnosis not present

## 2022-02-28 LAB — URINALYSIS, ROUTINE W REFLEX MICROSCOPIC
Bacteria, UA: NONE SEEN
Bilirubin Urine: NEGATIVE
Glucose, UA: NEGATIVE mg/dL
Hgb urine dipstick: NEGATIVE
Ketones, ur: 80 mg/dL — AB
Leukocytes,Ua: NEGATIVE
Nitrite: NEGATIVE
Protein, ur: 30 mg/dL — AB
Specific Gravity, Urine: 1.032 — ABNORMAL HIGH (ref 1.005–1.030)
Squamous Epithelial / HPF: NONE SEEN (ref 0–5)
pH: 5 (ref 5.0–8.0)

## 2022-02-28 LAB — COMPREHENSIVE METABOLIC PANEL
ALT: 46 U/L — ABNORMAL HIGH (ref 0–44)
AST: 27 U/L (ref 15–41)
Albumin: 4.7 g/dL (ref 3.5–5.0)
Alkaline Phosphatase: 69 U/L (ref 38–126)
Anion gap: 7 (ref 5–15)
BUN: 12 mg/dL (ref 6–20)
CO2: 24 mmol/L (ref 22–32)
Calcium: 9.9 mg/dL (ref 8.9–10.3)
Chloride: 106 mmol/L (ref 98–111)
Creatinine, Ser: 0.78 mg/dL (ref 0.61–1.24)
GFR, Estimated: >60 mL/min (ref >60)
Glucose, Bld: 109 mg/dL — ABNORMAL HIGH (ref 70–99)
Potassium: 3.7 mmol/L (ref 3.5–5.1)
Sodium: 137 mmol/L (ref 135–145)
Total Bilirubin: 1.5 mg/dL — ABNORMAL HIGH (ref 0.3–1.2)
Total Protein: 7.8 g/dL (ref 6.5–8.1)

## 2022-02-28 LAB — CBC WITH DIFFERENTIAL/PLATELET
Abs Immature Granulocytes: 0.02 10*3/uL (ref 0.00–0.07)
Basophils Absolute: 0 10*3/uL (ref 0.0–0.1)
Basophils Relative: 0 %
Eosinophils Absolute: 0.1 10*3/uL (ref 0.0–0.5)
Eosinophils Relative: 2 %
HCT: 46.3 % (ref 39.0–52.0)
Hemoglobin: 16 g/dL (ref 13.0–17.0)
Immature Granulocytes: 0 %
Lymphocytes Relative: 8 %
Lymphs Abs: 0.5 10*3/uL — ABNORMAL LOW (ref 0.7–4.0)
MCH: 28.3 pg (ref 26.0–34.0)
MCHC: 34.6 g/dL (ref 30.0–36.0)
MCV: 81.8 fL (ref 80.0–100.0)
Monocytes Absolute: 0.9 10*3/uL (ref 0.1–1.0)
Monocytes Relative: 13 %
Neutro Abs: 5.4 10*3/uL (ref 1.7–7.7)
Neutrophils Relative %: 77 %
Platelets: 251 10*3/uL (ref 150–400)
RBC: 5.66 MIL/uL (ref 4.22–5.81)
RDW: 12.8 % (ref 11.5–15.5)
WBC: 7 10*3/uL (ref 4.0–10.5)
nRBC: 0 % (ref 0.0–0.2)

## 2022-02-28 LAB — GROUP A STREP BY PCR: Group A Strep by PCR: NOT DETECTED

## 2022-02-28 LAB — SARS CORONAVIRUS 2 BY RT PCR: SARS Coronavirus 2 by RT PCR: NEGATIVE

## 2022-02-28 LAB — TROPONIN I (HIGH SENSITIVITY)
Troponin I (High Sensitivity): <2 ng/L (ref <18)
Troponin I (High Sensitivity): 2 ng/L (ref <18)

## 2022-02-28 LAB — LACTIC ACID, PLASMA: Lactic Acid, Venous: 1 mmol/L (ref 0.5–1.9)

## 2022-02-28 MED ORDER — GUAIFENESIN ER 600 MG PO TB12: 600.0000 mg | ORAL_TABLET | Freq: Two times a day (BID) | ORAL | 0 refills | Status: DC

## 2022-02-28 MED ORDER — ALBUTEROL SULFATE HFA 108 (90 BASE) MCG/ACT IN AERS
2.0000 | INHALATION_SPRAY | Freq: Four times a day (QID) | RESPIRATORY_TRACT | 0 refills | Status: DC | PRN
Start: 2022-02-28 — End: 2022-02-28

## 2022-02-28 MED ORDER — ALBUTEROL SULFATE HFA 108 (90 BASE) MCG/ACT IN AERS
2.0000 | INHALATION_SPRAY | Freq: Four times a day (QID) | RESPIRATORY_TRACT | 0 refills | Status: AC | PRN
Start: 2022-02-28 — End: ?

## 2022-02-28 MED ORDER — GUAIFENESIN ER 600 MG PO TB12: 600.0000 mg | ORAL_TABLET | Freq: Two times a day (BID) | ORAL | 0 refills | Status: AC

## 2022-02-28 MED ORDER — ACETAMINOPHEN 500 MG PO TABS
1000.0000 mg | ORAL_TABLET | Freq: Once | ORAL | Status: AC
  Administered 2022-02-28: 1000 mg via ORAL
  Filled 2022-02-28: qty 2

## 2022-02-28 NOTE — ED Triage Notes (Signed)
Pt to triage via w/c with no distress noted; pt reports palpitations and heart racing this morning accomp by dizziness; denies hx of same

## 2022-02-28 NOTE — ED Notes (Signed)
See triage note  presents with some episodes of rapid irreg heart   felt like "palpations"  denies any pain

## 2022-02-28 NOTE — ED Provider Notes (Signed)
[facility] Provider Note    Event Date/Time   First MD Initiated Contact with Patient 02/28/22 [phone]     (approximate)   History   Chief Complaint Palpitations   HPI  JIBRIEL LETENDRE is a 26 y.o. male who identifies as male with past medical history of asthma, Tourette's, and anxiety who presents to the ED complaining of palpitations.  Patient reports that she has been feeling ill for the past 24 hours with a sensation that her heart is racing.  She states that her heart rate got as high as the 160s at home and she feels like she has had a fever, but she has not taken anything for this.  She endorses a cough, congestion, sore throat, epigastric pain, nausea, dysuria, and flank pain.  She denies any chest pain, shortness of breath, or diarrhea.  She is not aware of any sick contacts.     Physical Exam   Triage Vital Signs: ED Triage Vitals  Enc Vitals Group     BP 02/28/22 0459 (!) 137/53     Pulse Rate 02/28/22 0459 (!) 120     Resp 02/28/22 0459 18     Temp 02/28/22 0459 (!) 102.1 F (38.9 C)     Temp Source 02/28/22 0459 Oral     SpO2 02/28/22 0459 98 %     Weight 02/28/22 0500 203 lb (92.1 kg)     Height 02/28/22 0500 5\' 7"  (1.702 m)     Head Circumference --      Peak Flow --      Pain Score 02/28/22 0500 0     Pain Loc --      Pain Edu? --      Excl. in [place]? --     Most recent vital signs: Vitals:   02/28/22 0459 02/28/22 0827  BP: (!) 137/53   Pulse: (!) 120 (!) 105  Resp: 18   Temp: (!) 102.1 F (38.9 C) 99 F (37.2 C)  SpO2: 98% 100%    Constitutional: Alert and oriented. Eyes: Conjunctivae are normal. Head: Atraumatic. Nose: No congestion/rhinnorhea. Mouth/Throat: Mucous membranes are moist.  Mild erythema and edema bilaterally with no exudates or uvular deviation. Neck: Supple with no meningismus. Cardiovascular: Tachycardic, regular rhythm. Grossly normal heart sounds.  2+ radial pulses bilaterally. Respiratory:  Normal respiratory effort.  No retractions. Lungs CTAB. Gastrointestinal: Soft and nontender. No distention. Musculoskeletal: No lower extremity tenderness nor edema.  Neurologic:  Normal speech and language. No gross focal neurologic deficits are appreciated.    ED Results / Procedures / Treatments   Labs (all labs ordered are listed, but only abnormal results are displayed) Labs Reviewed  CBC WITH DIFFERENTIAL/PLATELET - Abnormal; Notable for the following components:      Result Value   Lymphs Abs 0.5 (*)    All other components within normal limits  COMPREHENSIVE METABOLIC PANEL - Abnormal; Notable for the following components:   Glucose, Bld 109 (*)    ALT 46 (*)    Total Bilirubin 1.5 (*)    All other components within normal limits  URINALYSIS, ROUTINE W REFLEX MICROSCOPIC - Abnormal; Notable for the following components:   Color, Urine AMBER (*)    APPearance HAZY (*)    Specific Gravity, Urine 1.032 (*)    Ketones, ur 80 (*)    Protein, ur 30 (*)    All other components within normal limits  GROUP A STREP BY PCR  SARS CORONAVIRUS 2 BY  RT PCR  LACTIC ACID, PLASMA  TROPONIN I (HIGH SENSITIVITY)  TROPONIN I (HIGH SENSITIVITY)     EKG  ED ECG REPORT I, Blake Divine, the attending physician, personally viewed and interpreted this ECG.   Date: 02/28/2022  EKG Time: 5:03  Rate: 126  Rhythm: sinus tachycardia  Axis: RAD  Intervals: Incomplete RBBB  ST&T Change: None  RADIOLOGY Chest x-ray reviewed and interpreted by me with no infiltrate, edema, or effusion.  PROCEDURES:  Critical Care performed: No  Procedures   MEDICATIONS ORDERED IN ED: Medications  acetaminophen (TYLENOL) tablet 1,000 mg (1,000 mg Oral Given 02/28/22 0507)     IMPRESSION / MDM / ASSESSMENT AND PLAN / ED COURSE  I reviewed the triage vital signs and the nursing notes.                              26 y.o. adult with past medical history of asthma, anxiety, Tourette's syndrome  who presents to the ED complaining of elevated heart rate, malaise, cough, congestion, sore throat, nausea, and epigastric pain for the past 24 hours.  Patient's presentation is most consistent with acute presentation with potential threat to life or bodily function.  Differential diagnosis includes, but is not limited to, sepsis, pneumonia, UTI, pyelonephritis, COVID-19, influenza, strep pharyngitis, other viral syndrome.  Patient well-appearing and in no acute distress, vitals significant for fever and tachycardia but otherwise reassuring.  Patient appears well with no leukocytosis or lactic acidosis to suggest sepsis.  No evidence of pneumonia noted on chest x-ray, testing for COVID-19 and strep are both negative.  Urinalysis is pending, but additional labs are reassuring with no AKI or electrolyte abnormality, 2 sets of troponin are within normal limits and I doubt primary cardiac process.  Symptoms seem consistent with bronchitis or other viral illness, no abdominal tenderness on exam to necessitate further imaging.  Fever and heart rate are now improved following a dose of Tylenol.  Urinalysis shows no signs of infection, patient is appropriate for outpatient management with symptomatic treatment for suspected bronchitis.  She was counseled to follow-up with her PCP and to return to the ED for new or worsening symptoms, patient and family agree with plan.      FINAL CLINICAL IMPRESSION(S) / ED DIAGNOSES   Final diagnoses:  Palpitations  Viral syndrome  Bronchitis     Rx / DC Orders   ED Discharge Orders          Ordered    albuterol (VENTOLIN HFA) 108 (90 Base) MCG/ACT inhaler  Every 6 hours PRN       Note to Pharmacy: Please supply with spacer   02/28/22 0919    guaiFENesin (MUCINEX) 600 MG 12 hr tablet  2 times daily        02/28/22 0919             Note:  This document was prepared using Dragon voice recognition software and may include unintentional dictation  errors.   Blake Divine, MD 02/28/22 [phone]

## 2022-03-07 ENCOUNTER — Other Ambulatory Visit: Payer: Self-pay | Admitting: Gastroenterology

## 2022-03-07 DIAGNOSIS — R053 Chronic cough: Secondary | ICD-10-CM

## 2022-03-07 DIAGNOSIS — R0989 Other specified symptoms and signs involving the circulatory and respiratory systems: Secondary | ICD-10-CM

## 2022-03-10 ENCOUNTER — Ambulatory Visit
Admission: RE | Admit: 2022-03-10 | Discharge: 2022-03-10 | Disposition: A | Discharge status: Home / Self Care | Payer: Medicaid Other | Source: Ambulatory Visit | Attending: Gastroenterology | Admitting: Gastroenterology

## 2022-03-10 DIAGNOSIS — R053 Chronic cough: Secondary | ICD-10-CM | POA: Diagnosis present

## 2022-03-10 DIAGNOSIS — R0989 Other specified symptoms and signs involving the circulatory and respiratory systems: Secondary | ICD-10-CM | POA: Diagnosis present

## 2022-03-13 NOTE — Progress Notes (Signed)
Modified Barium Swallow Progress Note  Patient Details  Name: Gerald Olsen MRN: [id number] Date of Birth: 21-Sep-1996  Today's Date: 03/13/2022  Modified Barium Swallow completed.  Full report located under Chart Review in the Imaging Section.  Brief recommendations include the following:  Clinical Impression   Pt is a 26 year old transgender male who was referred by Vevelyn Pat on 03/07/2022 for Modified Barium Swallow Study d/t report of "sensation that there is something in upper throat when she eats or drinks anything. There has also been frequent coughing." Pt with significant weight lose as she is afraid to consume any solids. As such she is consuming protein shakes.   Pt presented as very anxious, fearful and nervous. When presented with puree, graham cracker and barium tablet, pt stated, "I can't swallow that, I will choke on that." With maximal reassurance and encouragement, pt was willing to consume each trial multiple times but had to "really concentrate, talk myself into" swallowing it.   Pt presents with adequate oropharyngeal abilities when consuming thin liquids via cup and straw, puree, graham crackers and whole barium tablet with thin liquids. At this time, recommend intensive behavioral health counseling as pt presents with possible anxiety related food aversions. Extensive education provided to pt with video feedback of her swallow function provided. Pt also instructed in cough suppression techniques as she appears to have developed chronic cough. Pt able to return demonstration with carryover observed. All of this information with recommendation for intensive counseling was given to pt's mother in waiting area. No further ST intervention is indicated at this time.   Swallow Evaluation Recommendations   Recommended Consults:  (behavioral health counseling)   SLP Diet Recommendations: Regular solids;Thin liquid   Liquid Administration via: Cup;Straw    Medication Administration: Whole meds with liquid   Supervision: Patient able to self feed   Compensations: Minimize environmental distractions;Slow rate;Small sips/bites   Postural Changes: Seated upright at 90 degrees   Oral Care Recommendations: Oral care BID       Navea Woodrow B. Dreama Saa, M.S., CCC-SLP, CBIS Speech-Language Pathologist Certified Brain Injury Specialist [facility]  [facility] [phone] Ascom [phone] Fax [phone]  Van Seymore Dreama Saa 03/13/2022,12:52 PM

## 2022-03-14 ENCOUNTER — Emergency Department: Payer: Medicaid Other

## 2022-03-14 ENCOUNTER — Emergency Department
Admission: EM | Admit: 2022-03-14 | Discharge: 2022-03-14 | Disposition: A | Discharge status: Home / Self Care | Payer: Medicaid Other | Attending: Emergency Medicine | Admitting: Emergency Medicine

## 2022-03-14 ENCOUNTER — Other Ambulatory Visit: Payer: Self-pay

## 2022-03-14 DIAGNOSIS — J209 Acute bronchitis, unspecified: Secondary | ICD-10-CM | POA: Insufficient documentation

## 2022-03-14 DIAGNOSIS — R059 Cough, unspecified: Secondary | ICD-10-CM | POA: Diagnosis present

## 2022-03-14 MED ORDER — AZITHROMYCIN 250 MG PO TABS: ORAL_TABLET | ORAL | 0 refills | Status: DC

## 2022-03-14 MED ORDER — PREDNISONE 10 MG (21) PO TBPK: ORAL_TABLET | ORAL | 0 refills | Status: DC

## 2022-03-14 NOTE — ED Provider Triage Note (Signed)
Emergency Medicine Provider Triage Evaluation Note  Gerald Olsen , a 26 y.o. adult  was evaluated in triage.  Pt complains of difficulty breathing, cough for about a week.  He was found inhaler without much relief.  Patient states they are not on any hormones.  Review of Systems  Positive: Cough and difficulty breathing Negative: Fever chills  Physical Exam  BP 122/79 (BP Location: Left Arm)   Pulse 83   Temp 98.4 F (36.9 C) (Oral)   Resp 17   SpO2 97%  Gen:   Awake, no distress   Resp:  Normal effort  MSK:   Moves extremities without difficulty  Other:    Medical Decision Making  Medically screening exam initiated at 6:50 PM.  Appropriate orders placed.  Gerald Olsen was informed that the remainder of the evaluation will be completed by another provider, this initial triage assessment does not replace that evaluation, and the importance of remaining in the ED until their evaluation is complete.  Chest x-ray   Faythe Ghee, PA-C 03/14/22 1851

## 2022-03-14 NOTE — ED Triage Notes (Signed)
Pt presents to ER c/o sob.  Pt states she has had a cough x1 week with phlegm.  Phlegm is yellow in color.  Pt also states she has some wheezing.  Denies hx of asthma.  Pt is A&O x4 at this time in NAD in triage.

## 2022-03-14 NOTE — Discharge Instructions (Signed)
Follow-up with your regular doctor if not improving 3 days.  Return emergency department worsening.  Take medication as prescribed. 

## 2022-03-14 NOTE — ED Provider Notes (Signed)
[facility] Provider Note    Event Date/Time   First MD Initiated Contact with Patient 03/14/22 1907     (approximate)   History   Shortness of Breath   HPI  Gerald Olsen is a 26 y.o. adult who is transgender presents emergency department complaining of cough for 1 week with yellow phlegm.  Is also having some wheezing and using their inhaler.  No fever or chills.  No swelling in lower legs.  Patient does not use any hormone therapy.      Physical Exam   Triage Vital Signs: ED Triage Vitals  Enc Vitals Group     BP 03/14/22 1845 122/79     Pulse Rate 03/14/22 1845 83     Resp 03/14/22 1845 17     Temp 03/14/22 1845 98.4 F (36.9 C)     Temp Source 03/14/22 1845 Oral     SpO2 03/14/22 1845 97 %     Weight 03/14/22 1859 198 lb (89.8 kg)     Height 03/14/22 1859 5\' 8"  (1.727 m)     Head Circumference --      Peak Flow --      Pain Score 03/14/22 1859 0     Pain Loc --      Pain Edu? --      Excl. in GC? --     Most recent vital signs: Vitals:   03/14/22 1845  BP: 122/79  Pulse: 83  Resp: 17  Temp: 98.4 F (36.9 C)  SpO2: 97%     General: Awake, no distress.   CV:  Good peripheral perfusion. regular rate and  rhythm Resp:  Normal effort. Lungs seen and Abd:  No distention.   Other:      ED Results / Procedures / Treatments   Labs (all labs ordered are listed, but only abnormal results are displayed) Labs Reviewed - No data to display   EKG     RADIOLOGY Chest x-ray    PROCEDURES:   Procedures   MEDICATIONS ORDERED IN ED: Medications - No data to display   IMPRESSION / MDM / ASSESSMENT AND PLAN / ED COURSE  I reviewed the triage vital signs and the nursing notes.                              Differential diagnosis includes, but is not limited to, acute bronchitis, CAP, PE  Patient's presentation is most consistent with acute complicated illness / injury requiring diagnostic workup.   Patient is  PERC negative therefore I think PE is less likely especially since patient is not taking hormone therapy  Chest x-ray reviewed and interpreted by me appears to be normal  Did explain findings to the patient.  They are to follow-up with their regular doctor.  Use your inhaler as needed.  Given a prescription for a Z-Pak and Sterapred.  Return if worsening.      FINAL CLINICAL IMPRESSION(S) / ED DIAGNOSES   Final diagnoses:  Acute bronchitis, unspecified organism     Rx / DC Orders   ED Discharge Orders          Ordered    azithromycin (ZITHROMAX Z-PAK) 250 MG tablet        03/14/22 1947    predniSONE (STERAPRED UNI-PAK 21 TAB) 10 MG (21) TBPK tablet        03/14/22 1947  Note:  This document was prepared using Dragon voice recognition software and may include unintentional dictation errors.    Faythe Ghee, PA-C 03/14/22 1948    Minna Antis, MD 03/14/22 [phone]

## 2022-03-15 ENCOUNTER — Encounter: Payer: Self-pay | Admitting: Emergency Medicine

## 2022-03-15 ENCOUNTER — Other Ambulatory Visit: Payer: Self-pay

## 2022-03-15 ENCOUNTER — Emergency Department
Admission: EM | Admit: 2022-03-15 | Discharge: 2022-03-15 | Disposition: A | Discharge status: Home / Self Care | Payer: Medicaid Other | Attending: Emergency Medicine | Admitting: Emergency Medicine

## 2022-03-15 ENCOUNTER — Emergency Department: Payer: Medicaid Other

## 2022-03-15 DIAGNOSIS — R519 Headache, unspecified: Secondary | ICD-10-CM | POA: Diagnosis present

## 2022-03-15 DIAGNOSIS — R202 Paresthesia of skin: Secondary | ICD-10-CM | POA: Insufficient documentation

## 2022-03-15 DIAGNOSIS — H538 Other visual disturbances: Secondary | ICD-10-CM | POA: Insufficient documentation

## 2022-03-15 LAB — BASIC METABOLIC PANEL WITH GFR
Anion gap: 9 (ref 5–15)
BUN: 11 mg/dL (ref 6–20)
CO2: 26 mmol/L (ref 22–32)
Calcium: 10.4 mg/dL — ABNORMAL HIGH (ref 8.9–10.3)
Chloride: 106 mmol/L (ref 98–111)
Creatinine, Ser: 0.75 mg/dL (ref 0.61–1.24)
GFR, Estimated: >60 mL/min
Glucose, Bld: 99 mg/dL (ref 70–99)
Potassium: 4.5 mmol/L (ref 3.5–5.1)
Sodium: 141 mmol/L (ref 135–145)

## 2022-03-15 LAB — CBC
HCT: 48.9 % (ref 39.0–52.0)
Hemoglobin: 16.8 g/dL (ref 13.0–17.0)
MCH: 28.4 pg (ref 26.0–34.0)
MCHC: 34.4 g/dL (ref 30.0–36.0)
MCV: 82.6 fL (ref 80.0–100.0)
Platelets: 340 10*3/uL (ref 150–400)
RBC: 5.92 MIL/uL — ABNORMAL HIGH (ref 4.22–5.81)
RDW: 13.5 % (ref 11.5–15.5)
WBC: 6 10*3/uL (ref 4.0–10.5)
nRBC: 0 % (ref 0.0–0.2)

## 2022-03-15 NOTE — ED Triage Notes (Signed)
Pt via POV from home. Pt has multiple symptoms. Pt c/o headache, "loss of vision", L eye twitching, bilateral hand numbness, and L sided hearing loss. Pt is A&Ox4 and NAD

## 2022-03-15 NOTE — ED Triage Notes (Signed)
Says last night his vision in both eyes was blurry.  Says he his left ear was not working and left eye twitching.  And headache

## 2022-03-15 NOTE — ED Provider Notes (Signed)
[facility]erald Olsen [facility] Provider Note  Patient Contact: 5:43 PM (approximate)   History   Headache and Numbness   HPI  Gerald Olsen is a 26 y.o. adult presents to the emergency department with mild headache and concern for possible CVA.  Patient is requesting head CT.  Patient reports that he had tingling in his upper extremities last night and some blurry vision before mild headache started.  She denies chest pain, chest tightness or shortness of breath.  She denies current blurry vision or weakness in the upper extremities.      Physical Exam   Triage Vital Signs: ED Triage Vitals  Enc Vitals Group     BP 03/15/22 1507 129/83     Pulse Rate 03/15/22 1507 94     Resp 03/15/22 1507 20     Temp 03/15/22 1507 98.9 F (37.2 C)     Temp Source 03/15/22 1507 Oral     SpO2 03/15/22 1507 95 %     Weight 03/15/22 1505 197 lb (89.4 kg)     Height 03/15/22 1505 5\' 8"  (1.727 m)     Head Circumference --      Peak Flow --      Pain Score 03/15/22 1505 0     Pain Loc --      Pain Edu? --      Excl. in GC? --     Most recent vital signs: Vitals:   03/15/22 1507 03/15/22 1813  BP: 129/83 127/70  Pulse: 94 71  Resp: 20 16  Temp: 98.9 F (37.2 C) 99.1 F (37.3 C)  SpO2: 95% 95%     General: Alert and in no acute distress. Eyes:  PERRL. EOMI. Head: No acute traumatic findings ENT:      Nose: No congestion/rhinnorhea.      Mouth/Throat: Mucous membranes are moist. Neck: No stridor. No cervical spine tenderness to palpation. Cardiovascular:  Good peripheral perfusion Respiratory: Normal respiratory effort without tachypnea or retractions. Lungs CTAB. Good air entry to the bases with no decreased or absent breath sounds. Gastrointestinal: Bowel sounds 4 quadrants. Soft and nontender to palpation. No guarding or rigidity. No palpable masses. No distention. No CVA tenderness. Musculoskeletal: Full range of motion to all extremities.  Neurologic:  No gross  focal neurologic deficits are appreciated.  Negative Romberg.  Cranial nerves II through XII intact.  Patient can perform rapid alternating movements in hand and nose. Skin:   No rash noted Other:   ED Results / Procedures / Treatments   Labs (all labs ordered are listed, but only abnormal results are displayed) Labs Reviewed  CBC - Abnormal; Notable for the following components:      Result Value   RBC 5.92 (*)    All other components within normal limits  BASIC METABOLIC PANEL - Abnormal; Notable for the following components:   Calcium 10.4 (*)    All other components within normal limits        RADIOLOGY  I personally viewed and evaluated these images as part of my medical decision making, as well as reviewing the written report by the radiologist.  ED Provider Interpretation: I personally interpreted head CT and there was no acute abnormality visualized.   PROCEDURES:  Critical Care performed: No  Procedures   MEDICATIONS ORDERED IN ED: Medications - No data to display   IMPRESSION / MDM / ASSESSMENT AND PLAN / ED COURSE  I reviewed the triage vital signs and the nursing notes.  Assessment and plan Headache 26 year old male presents to the emergency department with some mild headache and blurry vision that started last night.  CBC and BMP unremarkable.  Patient had a completely reassuring neuro exam and I advised patient that I thought she might have had a mild migraine with some associated aura.  Patient expressed concern about needing a head CT.  Head CT was unremarkable.  Tylenol and ibuprofen alternating were recommended at home.  All patient questions were answered.  Clinical Course as of 03/15/22 1956  Wed Mar 15, 2022  1756 CO2: 26 [JW]    Clinical Course User Index [JW] Gerald Olsen, [place]     FINAL CLINICAL IMPRESSION(S) / ED DIAGNOSES   Final diagnoses:  Acute nonintractable headache, unspecified headache  type     Rx / DC Orders   ED Discharge Orders     None        Note:  This document was prepared using Dragon voice recognition software and may include unintentional dictation errors.   Gerald Olsen [name], Gerald Olsen 03/15/22 1956    Gerald Noon, MD 03/16/22 [phone]

## 2022-03-15 NOTE — ED Notes (Addendum)
Patient with a long history of anxiety, c/o left-sided headache, dizziness and blurred vision that started last night. States he took a Buspar and aspirin; does not take Buspar regularly but more PRN, and went to sleep. States this does not feel like normal anxiety attacks, and that father has had multiple strokes in the past. Patient was seen yesterday and diagnosed with bronchitis. Was prescribed a steroid pack.

## 2022-03-17 ENCOUNTER — Ambulatory Visit: Payer: Medicaid Other

## 2022-03-22 ENCOUNTER — Ambulatory Visit
Admission: RE | Admit: 2022-03-22 | Discharge: 2022-03-22 | Disposition: A | Discharge status: Home / Self Care | Payer: Medicaid Other | Source: Ambulatory Visit | Attending: Physician Assistant | Admitting: Physician Assistant

## 2022-03-22 ENCOUNTER — Ambulatory Visit
Admission: RE | Admit: 2022-03-22 | Discharge: 2022-03-22 | Disposition: A | Discharge status: Home / Self Care | Payer: Medicaid Other | Attending: Physician Assistant | Admitting: Physician Assistant

## 2022-03-22 ENCOUNTER — Other Ambulatory Visit: Payer: Self-pay | Admitting: Physician Assistant

## 2022-03-22 ENCOUNTER — Emergency Department
Admission: EM | Admit: 2022-03-22 | Discharge: 2022-03-22 | Disposition: A | Discharge status: Home / Self Care | Payer: Medicaid Other | Attending: Emergency Medicine | Admitting: Emergency Medicine

## 2022-03-22 DIAGNOSIS — R410 Disorientation, unspecified: Secondary | ICD-10-CM | POA: Diagnosis not present

## 2022-03-22 DIAGNOSIS — R002 Palpitations: Secondary | ICD-10-CM | POA: Diagnosis present

## 2022-03-22 DIAGNOSIS — R5383 Other fatigue: Secondary | ICD-10-CM

## 2022-03-22 LAB — BASIC METABOLIC PANEL
Anion gap: 7 (ref 5–15)
BUN: 11 mg/dL (ref 6–20)
CO2: 29 mmol/L (ref 22–32)
Calcium: 10.3 mg/dL (ref 8.9–10.3)
Chloride: 105 mmol/L (ref 98–111)
Creatinine, Ser: 0.74 mg/dL (ref 0.61–1.24)
GFR, Estimated: >60 mL/min (ref >60)
Glucose, Bld: 90 mg/dL (ref 70–99)
Potassium: 3.8 mmol/L (ref 3.5–5.1)
Sodium: 141 mmol/L (ref 135–145)

## 2022-03-22 LAB — CBC
HCT: 45.5 % (ref 39.0–52.0)
Hemoglobin: 15.4 g/dL (ref 13.0–17.0)
MCH: 28.3 pg (ref 26.0–34.0)
MCHC: 33.8 g/dL (ref 30.0–36.0)
MCV: 83.5 fL (ref 80.0–100.0)
Platelets: 403 10*3/uL — ABNORMAL HIGH (ref 150–400)
RBC: 5.45 MIL/uL (ref 4.22–5.81)
RDW: 13.9 % (ref 11.5–15.5)
WBC: 9 10*3/uL (ref 4.0–10.5)
nRBC: 0 % (ref 0.0–0.2)

## 2022-03-22 LAB — TROPONIN I (HIGH SENSITIVITY): Troponin I (High Sensitivity): <2 ng/L (ref <18)

## 2022-03-22 NOTE — Discharge Instructions (Signed)
Follow-up with your Cardiology and Neurology referrals as we discussed  Return to the ER as needed

## 2022-03-22 NOTE — ED Triage Notes (Signed)
Pt arrives with c/o palpitations that started a few days ago. Pt was walking and felt palpitations. Pt also endorses fatigue and some chest burning. Pt denies n/v or fevers.

## 2022-03-22 NOTE — ED Provider Notes (Signed)
[facility] Provider Note    Event Date/Time   First MD Initiated Contact with Patient 03/22/22 1746     (approximate)   History   Palpitations   HPI  Gerald Olsen is a 26 y.o. adult here with palpitations.  Patient states that she has had intermittent episodes of palpitations, particularly over the last 2 days.  She states that she feels a very obvious, pronounced, extra or harder than usual heartbeat.  This is occurred several times, transiently, and is not necessarily associated with anything in particular.  Today, she was walking in the hospital, as she had gotten a chest x-ray as part of work-up for more chronic issues, which have been worsening over the last several weeks to months.  She has had increasing episodes in which she feels pressure in her head, slight confusion, feels like she is going to "have a seizure" with increased stress, as well as episodes of feeling short of breath.  She is being referred to cardiology as well as neurology for this.  No history of epilepsy.  She does have a history of anxiety but feels like her current episodes are not necessarily associated with this.  No current chest pain or palpitations.  No ongoing drug use.     Physical Exam   Triage Vital Signs: ED Triage Vitals [03/22/22 1654]  Enc Vitals Group     BP 123/72     Pulse Rate 80     Resp 20     Temp 98.3 F (36.8 C)     Temp src      SpO2 99 %     Weight      Height      Head Circumference      Peak Flow      Pain Score      Pain Loc      Pain Edu?      Excl. in GC?     Most recent vital signs: Vitals:   03/22/22 1654  BP: 123/72  Pulse: 80  Resp: 20  Temp: 98.3 F (36.8 C)  SpO2: 99%     General: Awake, no distress.  CV:  Good peripheral perfusion.  Regular rate and rhythm.  No murmurs. Resp:  Normal effort.  Lungs clear to auscultation bilaterally with no wheezes, rales, or rhonchi. Abd:  No distention.  No  tenderness. Other:  Radial pulses 2+ and symmetric bilaterally, regular.   ED Results / Procedures / Treatments   Labs (all labs ordered are listed, but only abnormal results are displayed) Labs Reviewed  CBC - Abnormal; Notable for the following components:      Result Value   Platelets 403 (*)    All other components within normal limits  BASIC METABOLIC PANEL  TROPONIN I (HIGH SENSITIVITY)  TROPONIN I (HIGH SENSITIVITY)     EKG Normal sinus rhythm, ventricular rate 91.  PR 132, QRS 100, QTc 432.  No acute ST elevations or depressions.   RADIOLOGY    I also independently reviewed and agree with radiologist interpretations.   PROCEDURES:  Critical Care performed: No   MEDICATIONS ORDERED IN ED: Medications - No data to display   IMPRESSION / MDM / ASSESSMENT AND PLAN / ED COURSE  I reviewed the triage vital signs and the nursing notes.  Ddx:  Differential includes the following, with pertinent life- or limb-threatening emergencies considered:  Symptomatic PVCs, transient SVT/arrhythmia, anxiety, panic disorder, esophageal spasm, GERD  Patient's presentation is most consistent with acute illness / injury with system symptoms.  MDM:  26 yo M (TG MTF) here with intermittent palpitations. Now resolved. EKG is nonischemic with normal intervals. No ectopy noted on exam. CBC without any leukocytosis or anemia. BMP shows normal lytes, renal function. Trop is negative. Pt is not tachycardic or tachypneic, no hypoxia, no signs of PE.   DDx includes possible transient benign arrhythmia, symptomatic PVCs, possibly anxiety. Pt is being referred to Cardiology and Neurology as outpt, encouraged to continue this with good return precautions. No apparent indication for admission at this time.   MEDICATIONS GIVEN IN ED: Medications - No data to display   Consults:  None   EMR reviewed       FINAL CLINICAL IMPRESSION(S) / ED DIAGNOSES    Final diagnoses:  Palpitations     Rx / DC Orders   ED Discharge Orders     None        Note:  This document was prepared using Dragon voice recognition software and may include unintentional dictation errors.   Shaune Pollack, MD 03/22/22 [phone]

## 2022-03-31 ENCOUNTER — Encounter: Payer: Self-pay | Admitting: Emergency Medicine

## 2022-03-31 DIAGNOSIS — G43809 Other migraine, not intractable, without status migrainosus: Secondary | ICD-10-CM | POA: Insufficient documentation

## 2022-03-31 DIAGNOSIS — R519 Headache, unspecified: Secondary | ICD-10-CM | POA: Diagnosis present

## 2022-03-31 LAB — URINALYSIS, ROUTINE W REFLEX MICROSCOPIC
Bilirubin Urine: NEGATIVE
Glucose, UA: NEGATIVE mg/dL
Hgb urine dipstick: NEGATIVE
Ketones, ur: NEGATIVE mg/dL
Leukocytes,Ua: NEGATIVE
Nitrite: NEGATIVE
Protein, ur: NEGATIVE mg/dL
Specific Gravity, Urine: 1.004 — ABNORMAL LOW (ref 1.005–1.030)
pH: 8 (ref 5.0–8.0)

## 2022-03-31 LAB — CBC WITH DIFFERENTIAL/PLATELET
Abs Immature Granulocytes: 0.02 10*3/uL (ref 0.00–0.07)
Basophils Absolute: 0 10*3/uL (ref 0.0–0.1)
Basophils Relative: 1 %
Eosinophils Absolute: 0.5 10*3/uL (ref 0.0–0.5)
Eosinophils Relative: 8 %
HCT: 46.4 % (ref 39.0–52.0)
Hemoglobin: 16.1 g/dL (ref 13.0–17.0)
Immature Granulocytes: 0 %
Lymphocytes Relative: 26 %
Lymphs Abs: 1.7 10*3/uL (ref 0.7–4.0)
MCH: 28.5 pg (ref 26.0–34.0)
MCHC: 34.7 g/dL (ref 30.0–36.0)
MCV: 82.1 fL (ref 80.0–100.0)
Monocytes Absolute: 0.5 10*3/uL (ref 0.1–1.0)
Monocytes Relative: 8 %
Neutro Abs: 3.7 10*3/uL (ref 1.7–7.7)
Neutrophils Relative %: 57 %
Platelets: 328 10*3/uL (ref 150–400)
RBC: 5.65 MIL/uL (ref 4.22–5.81)
RDW: 13.7 % (ref 11.5–15.5)
WBC: 6.5 10*3/uL (ref 4.0–10.5)
nRBC: 0 % (ref 0.0–0.2)

## 2022-03-31 LAB — URINE DRUG SCREEN, QUALITATIVE (ARMC ONLY)
Amphetamines, Ur Screen: NOT DETECTED
Barbiturates, Ur Screen: NOT DETECTED
Benzodiazepine, Ur Scrn: NOT DETECTED
Cannabinoid 50 Ng, Ur ~~LOC~~: NOT DETECTED
Cocaine Metabolite,Ur ~~LOC~~: NOT DETECTED
MDMA (Ecstasy)Ur Screen: NOT DETECTED
Methadone Scn, Ur: NOT DETECTED
Opiate, Ur Screen: NOT DETECTED
Phencyclidine (PCP) Ur S: NOT DETECTED
Tricyclic, Ur Screen: NOT DETECTED

## 2022-03-31 LAB — COMPREHENSIVE METABOLIC PANEL
ALT: 35 U/L (ref 0–44)
AST: 26 U/L (ref 15–41)
Albumin: 4.9 g/dL (ref 3.5–5.0)
Alkaline Phosphatase: 62 U/L (ref 38–126)
Anion gap: 6 (ref 5–15)
BUN: 9 mg/dL (ref 6–20)
CO2: 25 mmol/L (ref 22–32)
Calcium: 10.1 mg/dL (ref 8.9–10.3)
Chloride: 107 mmol/L (ref 98–111)
Creatinine, Ser: 0.69 mg/dL (ref 0.61–1.24)
GFR, Estimated: >60 mL/min (ref >60)
Glucose, Bld: 105 mg/dL — ABNORMAL HIGH (ref 70–99)
Potassium: 4.1 mmol/L (ref 3.5–5.1)
Sodium: 138 mmol/L (ref 135–145)
Total Bilirubin: 1.3 mg/dL — ABNORMAL HIGH (ref 0.3–1.2)
Total Protein: 7.7 g/dL (ref 6.5–8.1)

## 2022-03-31 NOTE — ED Notes (Signed)
Pt presents to ER from home via EMS with rt sided head pain reports pain for the past 2 months on and off, developed pain this evening. Took 2 baby ASA and all his night medication

## 2022-03-31 NOTE — ED Triage Notes (Signed)
Pt complains of eye twitching, head numbness. Pt states it began approx 30 min pta. Pt appears in no acute distress. Pt complains of weakness.

## 2022-03-31 NOTE — ED Provider Triage Note (Signed)
Emergency Medicine Provider Triage Evaluation Note  Gerald Olsen, a 26 y.o. adult  was evaluated in triage.  Pt complains of headache and forehead numbness.  Review of Systems  Positive: Headache and paresthesias across the scalp and forehead Negative: FCS  Physical Exam  Pulse 98   Temp 98.3 F (36.8 C) (Oral)   Resp 20   Ht 5\' 8"  (1.727 m)   Wt 90 kg   SpO2 98%   BMI 30.17 kg/m  Gen:   Awake, no distress NAD Resp:  Normal effort CTA MSK:   Moves extremities without difficulty  Other:    Medical Decision Making  Medically screening exam initiated at 10:43 PM.  Appropriate orders placed.  Gerald Olsen was informed that the remainder of the evaluation will be completed by another provider, this initial triage assessment does not replace that evaluation, and the importance of remaining in the ED until their evaluation is complete.  Patient to the ED for evaluation of some frontal headache and facial paresthesias.   Wenda Overland, PA-C 03/31/22 2244

## 2022-03-31 NOTE — ED Notes (Signed)
Pt seen by Lynelle Smoke in triage for orders. Blood and urine in lab.

## 2022-03-31 NOTE — ED Notes (Signed)
Lab contacted about labs and UA order.

## 2022-04-01 ENCOUNTER — Emergency Department
Admission: EM | Admit: 2022-04-01 | Discharge: 2022-04-01 | Disposition: A | Discharge status: Home / Self Care | Payer: Medicaid Other | Attending: Emergency Medicine | Admitting: Emergency Medicine

## 2022-04-01 DIAGNOSIS — R519 Headache, unspecified: Secondary | ICD-10-CM

## 2022-04-01 DIAGNOSIS — G43109 Migraine with aura, not intractable, without status migrainosus: Secondary | ICD-10-CM

## 2022-04-01 NOTE — ED Notes (Signed)
E-signature pad unavailable - Pt verbalized understanding of D/C information - no additional concerns at this time.  

## 2022-04-01 NOTE — ED Provider Notes (Signed)
[facility] Provider Note    Event Date/Time   First MD Initiated Contact with Patient 04/01/22 [phone]     (approximate)   History   Headache   HPI  Gerald Olsen is a 26 y.o. adult who presents to the ED for evaluation of Headache   Transgender male patient.  Seen acutely for headache and concern for a stroke 2 weeks ago and I reviewed the CT head without evidence of acute intracranial pathology from that visit.  Patient presents to the ED for evaluation of a headache with associated facial numbness sensation for the past few hours.  She reports "just sitting there" watching television when she developed a pressure in her head and shortly after this numbness sensation and tingling to her bilateral face.  Similar sensation to the right side of her body.  This lasted a few hours but then the headache and these coinciding symptoms resolved while she was waiting for room.   At the time I see the patient she reports feeling better and symptoms have resolved.  She questions if she needs an MRI of her brain.  Physical Exam   Triage Vital Signs: ED Triage Vitals  Enc Vitals Group     BP 03/31/22 2237 112/79     Pulse Rate 03/31/22 2237 98     Resp 03/31/22 2237 20     Temp 03/31/22 2237 98.3 F (36.8 C)     Temp Source 03/31/22 2237 Oral     SpO2 03/31/22 2237 98 %     Weight 03/31/22 2238 198 lb 6.6 oz (90 kg)     Height 03/31/22 2238 5\' 8"  (1.727 m)     Head Circumference --      Peak Flow --      Pain Score --      Pain Loc --      Pain Edu? --      Excl. in GC? --     Most recent vital signs: Vitals:   04/01/22 0103 04/01/22 0255  BP: 116/69 113/81  Pulse: 80 81  Resp: 20 20  Temp: 99.7 F (37.6 C) 98.9 F (37.2 C)  SpO2: 96% 97%    General: Awake, no distress.  CV:  Good peripheral perfusion.  Resp:  Normal effort.  Abd:  No distention.  MSK:  No deformity noted.  Neuro:  No focal deficits appreciated. Cranial nerves II through  XII intact 5/5 strength and sensation in all 4 extremities Other:     ED Results / Procedures / Treatments   Labs (all labs ordered are listed, but only abnormal results are displayed) Labs Reviewed  COMPREHENSIVE METABOLIC PANEL - Abnormal; Notable for the following components:      Result Value   Glucose, Bld 105 (*)    Total Bilirubin 1.3 (*)    All other components within normal limits  URINALYSIS, ROUTINE W REFLEX MICROSCOPIC - Abnormal; Notable for the following components:   Color, Urine STRAW (*)    APPearance CLEAR (*)    Specific Gravity, Urine 1.004 (*)    All other components within normal limits  URINE DRUG SCREEN, QUALITATIVE ([facility] ONLY)  CBC WITH DIFFERENTIAL/PLATELET    EKG   RADIOLOGY   Official radiology report(s): No results found.  PROCEDURES and INTERVENTIONS:  Procedures  Medications - No data to display   IMPRESSION / MDM / ASSESSMENT AND PLAN / ED COURSE  I reviewed the triage vital signs and the nursing notes.  Differential diagnosis includes, but is not limited to, stroke, seizure, complex migraine, anxiety  26 year old presents to the ED with sensation changes in the setting of a headache, resolved with a headache and possibly representing a complex migraine but ultimately suitable for outpatient management.  Looks systemically well with a reassuring examination without neurologic or vascular deficits.  No signs of trauma.  Blood work is reassuring with normal hemoglobin and metabolic panel.  Urine is clear.  I see no indications for CNS imaging.  Discussed management of headaches at home and close return precautions for the ED.      FINAL CLINICAL IMPRESSION(S) / ED DIAGNOSES   Final diagnoses:  Bad headache  Complicated migraine     Rx / DC Orders   ED Discharge Orders     None        Note:  This document was prepared using Dragon voice recognition software and may include unintentional dictation errors.   Delton Prairie, MD 04/01/22 [phone]

## 2022-04-01 NOTE — Discharge Instructions (Addendum)
Please take Tylenol and ibuprofen/Advil for your pain.  It is safe to take them together, or to alternate them every few hours.  Take up to 1000mg of Tylenol at a time, up to 4 times per day.  Do not take more than 4000 mg of Tylenol in 24 hours.  For ibuprofen, take 400-600 mg, 3 - 4 times per day.  

## 2022-04-05 ENCOUNTER — Other Ambulatory Visit: Payer: Self-pay | Admitting: Physician Assistant

## 2022-04-05 DIAGNOSIS — G609 Hereditary and idiopathic neuropathy, unspecified: Secondary | ICD-10-CM

## 2022-04-07 ENCOUNTER — Emergency Department
Admission: EM | Admit: 2022-04-07 | Discharge: 2022-04-07 | Disposition: A | Discharge status: Home / Self Care | Payer: Medicaid Other | Attending: Emergency Medicine | Admitting: Emergency Medicine

## 2022-04-07 ENCOUNTER — Encounter: Payer: Self-pay | Admitting: Emergency Medicine

## 2022-04-07 ENCOUNTER — Other Ambulatory Visit: Payer: Self-pay

## 2022-04-07 DIAGNOSIS — R202 Paresthesia of skin: Secondary | ICD-10-CM | POA: Insufficient documentation

## 2022-04-07 DIAGNOSIS — R000 Tachycardia, unspecified: Secondary | ICD-10-CM | POA: Insufficient documentation

## 2022-04-07 LAB — COMPREHENSIVE METABOLIC PANEL
ALT: 40 U/L (ref 0–44)
AST: 29 U/L (ref 15–41)
Albumin: 4.8 g/dL (ref 3.5–5.0)
Alkaline Phosphatase: 64 U/L (ref 38–126)
Anion gap: 8 (ref 5–15)
BUN: 13 mg/dL (ref 6–20)
CO2: 26 mmol/L (ref 22–32)
Calcium: 10.1 mg/dL (ref 8.9–10.3)
Chloride: 106 mmol/L (ref 98–111)
Creatinine, Ser: 0.73 mg/dL (ref 0.61–1.24)
GFR, Estimated: >60 mL/min (ref >60)
Glucose, Bld: 127 mg/dL — ABNORMAL HIGH (ref 70–99)
Potassium: 4 mmol/L (ref 3.5–5.1)
Sodium: 140 mmol/L (ref 135–145)
Total Bilirubin: 1.3 mg/dL — ABNORMAL HIGH (ref 0.3–1.2)
Total Protein: 7.7 g/dL (ref 6.5–8.1)

## 2022-04-07 LAB — CBC WITH DIFFERENTIAL/PLATELET
Abs Immature Granulocytes: 0.01 10*3/uL (ref 0.00–0.07)
Basophils Absolute: 0 10*3/uL (ref 0.0–0.1)
Basophils Relative: 0 %
Eosinophils Absolute: 0.5 10*3/uL (ref 0.0–0.5)
Eosinophils Relative: 7 %
HCT: 46.2 % (ref 39.0–52.0)
Hemoglobin: 16.2 g/dL (ref 13.0–17.0)
Immature Granulocytes: 0 %
Lymphocytes Relative: 25 %
Lymphs Abs: 1.9 10*3/uL (ref 0.7–4.0)
MCH: 29.3 pg (ref 26.0–34.0)
MCHC: 35.1 g/dL (ref 30.0–36.0)
MCV: 83.7 fL (ref 80.0–100.0)
Monocytes Absolute: 0.5 10*3/uL (ref 0.1–1.0)
Monocytes Relative: 7 %
Neutro Abs: 4.5 10*3/uL (ref 1.7–7.7)
Neutrophils Relative %: 61 %
Platelets: 319 10*3/uL (ref 150–400)
RBC: 5.52 MIL/uL (ref 4.22–5.81)
RDW: 13.3 % (ref 11.5–15.5)
WBC: 7.4 10*3/uL (ref 4.0–10.5)
nRBC: 0 % (ref 0.0–0.2)

## 2022-04-07 LAB — CBG MONITORING, ED: Glucose-Capillary: 129 mg/dL — ABNORMAL HIGH (ref 70–99)

## 2022-04-07 NOTE — ED Triage Notes (Addendum)
Pt presents via POV with complaints of facial tingling and "feeling like nothing's there like my whole head is numb". Pt was seen here last week for the same. NIHSS 0. Pt states that she's scheduled for an MRI coming up for these sx but didn't want to wait until then. Hx of Tourette's. Denies CP, SOB, Fevers, N/V.    CBG 129.

## 2022-04-07 NOTE — ED Notes (Signed)
See triage note  Presents with some facial and arm numbness.  States this has been going on for a while  States she has been seen for same  scheduled for MRI    states the facial numbness stays but changes in intensity

## 2022-04-07 NOTE — ED Provider Notes (Signed)
[facility] Provider Note    Event Date/Time   First MD Initiated Contact with Patient 04/07/22 [phone]     (approximate)   History   Chief Complaint: Numbness   HPI  Gerald Olsen is a 26 y.o. 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  They describe having tachycardia, extreme thirst, and "out of body" experience during that episode.  Since then, continued to have unusual symptoms.  They followed up with their PCP who obtained CT head which was unremarkable.  MRI head is scheduled in 2 days.  Patient has been taking Luvox and buspirone although buspirone compliance has been sporadic.  Denies any significant chest pain shortness of breath neck pain vision change motor weakness loss of balance or coordination.  No head trauma.  No vomiting or diarrhea.  They also note poor oral intake for the past several weeks, nutrition consisting mostly of liquid nutritional supplements.  Patient describes GERD symptoms and a feeling of dysphagia.  Scheduled to see gastroenterologist in the future.     Physical Exam   Triage Vital Signs: ED Triage Vitals  Enc Vitals Group     BP 04/07/22 0529 (!) 117/98     Pulse Rate 04/07/22 0529 (!) 106     Resp 04/07/22 0529 20     Temp 04/07/22 0529 98.9 F (37.2 C)     Temp Source 04/07/22 0529 Oral     SpO2 04/07/22 0529 98 %     Weight 04/07/22 0525 202 lb 13.2 oz (92 kg)     Height 04/07/22 0525 5\' 8"  (1.727 m)     Head Circumference --      Peak Flow --      Pain Score 04/07/22 0525 0     Pain Loc --      Pain Edu? --      Excl. in GC? --     Most recent vital signs: Vitals:   04/07/22 0529  BP: (!) 117/98  Pulse: (!) 106  Resp: 20  Temp: 98.9 F (37.2 C)  SpO2: 98%    General: Awake, no distress.  CV:  Good peripheral perfusion.  Resp:  Normal effort.   Abd:  No distention.  Other:  PERRL, EOMI, no nystagmus.  Normal cerebellar function.  Steady gait, no weakness.  Frequent involuntary twitching movements   ED Results / Procedures / Treatments   Labs (all labs ordered are listed, but only abnormal results are displayed) Labs Reviewed  COMPREHENSIVE METABOLIC PANEL - Abnormal; Notable for the following components:      Result Value   Glucose, Bld 127 (*)    Total Bilirubin 1.3 (*)    All other components within normal limits  CBG MONITORING, ED - Abnormal; Notable for the following components:   Glucose-Capillary 129 (*)    All other components within normal limits  CBC WITH DIFFERENTIAL/PLATELET     EKG    RADIOLOGY    PROCEDURES:  Procedures   MEDICATIONS ORDERED IN ED: Medications - No data to display   IMPRESSION / MDM / ASSESSMENT AND PLAN / ED COURSE  I reviewed the triage vital signs and the nursing notes.  Patient comes to the ED complaining of paresthesias.  Symptoms are intermittent without aggravating or alleviating factors. Considering the patient's symptoms, medical history, and physical examination today, I have low suspicion for ischemic stroke, intracranial hemorrhage, meningitis, encephalitis, carotid or vertebral dissection, venous sinus thrombosis, MS, intracranial hypertension, glaucoma, CRAO, CRVO, or temporal arteritis. I suspect symptoms are related to anxiety, possibly depression.  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  Stable to continue with current outpatient management plan.       FINAL CLINICAL IMPRESSION(S) / ED DIAGNOSES   Final diagnoses:  Paresthesia     Rx / DC Orders   ED Discharge Orders     None        Note:  This document was prepared using Dragon voice recognition software and may include unintentional dictation errors.   Sharman Cheek,  MD 04/07/22 [phone]

## 2022-04-09 ENCOUNTER — Ambulatory Visit
Admission: RE | Admit: 2022-04-09 | Discharge: 2022-04-09 | Disposition: A | Discharge status: Home / Self Care | Payer: Medicaid Other | Source: Ambulatory Visit | Attending: Physician Assistant | Admitting: Physician Assistant

## 2022-04-09 DIAGNOSIS — G609 Hereditary and idiopathic neuropathy, unspecified: Secondary | ICD-10-CM | POA: Diagnosis present

## 2022-04-09 MED ORDER — GADOBUTROL 1 MMOL/ML IV SOLN
9.0000 mL | Freq: Once | INTRAVENOUS | Status: AC | PRN
  Administered 2022-04-09: 10 mL via INTRAVENOUS

## 2022-04-17 ENCOUNTER — Emergency Department
Admission: EM | Admit: 2022-04-17 | Discharge: 2022-04-17 | Discharge status: Absent without leave | Payer: Medicaid Other | Source: Home / Self Care

## 2022-04-17 ENCOUNTER — Ambulatory Visit
Admission: RE | Admit: 2022-04-17 | Discharge: 2022-04-17 | Disposition: A | Discharge status: Home / Self Care | Payer: Medicaid Other | Attending: Physician Assistant | Admitting: Physician Assistant

## 2022-04-17 ENCOUNTER — Ambulatory Visit
Admission: RE | Admit: 2022-04-17 | Discharge: 2022-04-17 | Disposition: A | Discharge status: Home / Self Care | Payer: Medicaid Other | Source: Ambulatory Visit | Attending: Physician Assistant | Admitting: Physician Assistant

## 2022-04-17 ENCOUNTER — Other Ambulatory Visit: Payer: Self-pay | Admitting: Physician Assistant

## 2022-04-17 DIAGNOSIS — G44229 Chronic tension-type headache, not intractable: Secondary | ICD-10-CM | POA: Insufficient documentation

## 2022-04-24 ENCOUNTER — Emergency Department
Admission: EM | Admit: 2022-04-24 | Discharge: 2022-04-24 | Disposition: A | Discharge status: Home / Self Care | Payer: Medicaid Other | Attending: Emergency Medicine | Admitting: Emergency Medicine

## 2022-04-24 ENCOUNTER — Emergency Department: Payer: Medicaid Other

## 2022-04-24 ENCOUNTER — Encounter: Payer: Self-pay | Admitting: Emergency Medicine

## 2022-04-24 DIAGNOSIS — R079 Chest pain, unspecified: Secondary | ICD-10-CM | POA: Insufficient documentation

## 2022-04-24 DIAGNOSIS — R209 Unspecified disturbances of skin sensation: Secondary | ICD-10-CM | POA: Insufficient documentation

## 2022-04-24 DIAGNOSIS — R002 Palpitations: Secondary | ICD-10-CM | POA: Insufficient documentation

## 2022-04-24 DIAGNOSIS — F419 Anxiety disorder, unspecified: Secondary | ICD-10-CM | POA: Insufficient documentation

## 2022-04-24 LAB — BASIC METABOLIC PANEL
Anion gap: 12 (ref 5–15)
BUN: 15 mg/dL (ref 6–20)
CO2: 23 mmol/L (ref 22–32)
Calcium: 10 mg/dL (ref 8.9–10.3)
Chloride: 104 mmol/L (ref 98–111)
Creatinine, Ser: 0.68 mg/dL (ref 0.61–1.24)
GFR, Estimated: >60 mL/min (ref >60)
Glucose, Bld: 111 mg/dL — ABNORMAL HIGH (ref 70–99)
Potassium: 3.6 mmol/L (ref 3.5–5.1)
Sodium: 139 mmol/L (ref 135–145)

## 2022-04-24 LAB — CBC
HCT: 47.2 % (ref 39.0–52.0)
Hemoglobin: 16.2 g/dL (ref 13.0–17.0)
MCH: 28.4 pg (ref 26.0–34.0)
MCHC: 34.3 g/dL (ref 30.0–36.0)
MCV: 82.8 fL (ref 80.0–100.0)
Platelets: 328 10*3/uL (ref 150–400)
RBC: 5.7 MIL/uL (ref 4.22–5.81)
RDW: 13.2 % (ref 11.5–15.5)
WBC: 11.2 10*3/uL — ABNORMAL HIGH (ref 4.0–10.5)
nRBC: 0 % (ref 0.0–0.2)

## 2022-04-24 LAB — TROPONIN I (HIGH SENSITIVITY)
Troponin I (High Sensitivity): <2 ng/L (ref <18)
Troponin I (High Sensitivity): <2 ng/L (ref <18)

## 2022-04-24 LAB — SALICYLATE LEVEL: Salicylate Lvl: <7 mg/dL — ABNORMAL LOW (ref 7.0–30.0)

## 2022-04-24 MED ORDER — HYDROXYZINE PAMOATE 50 MG PO CAPS: 50.0000 mg | ORAL_CAPSULE | Freq: Three times a day (TID) | ORAL | 0 refills | Status: AC | PRN

## 2022-04-24 MED ORDER — BUSPIRONE HCL 10 MG PO TABS: 10.0000 mg | ORAL_TABLET | Freq: Two times a day (BID) | ORAL | 0 refills | Status: AC

## 2022-04-24 NOTE — ED Provider Notes (Signed)
[facility] Provider Note    Event Date/Time   First MD Initiated Contact with Patient 04/24/22 2222     (approximate)   History   Chief Complaint Chest Pain   HPI Gerald Olsen is a 26 y.o. adult, history of asthma, ADHD, anxiety, depression, presents the emergency department for evaluation of chest pain.  Patient states that his chest pain started earlier today when he was running to grab his phone to answer a phone call.  Describes having numbness/tingling sensation in his arms and legs and felt palpitations.  He states that he self treated with aspirin prior to arrival.  He states that his symptoms have mostly resolved since.  He brings up several other concerns though, including concern for rabies due to increased salivation, intermittent episodes of face/head/body numbness, decreased appetite, weight loss, and frequently feeling like he is going to die.  Additionally reports tactile sensations on his scalp, neck, and arms.  The symptoms been occurring on and off for the past several months.  He states that he has usually been good about taking his anxiety medication, though he has ran out of his buspirone and has not taken his fluvoxamine recently.  Denies fever/chills, abdominal pain, shortness of breath, nausea/vomiting, diarrhea, vision changes, hearing changes, SI/HI, visual hallucinations, or auditory hallucinations.  History Limitations: Tangential speech.        Physical Exam  Triage Vital Signs: ED Triage Vitals  Enc Vitals Group     BP 04/24/22 2039 (!) 156/76     Pulse Rate 04/24/22 2039 (!) 112     Resp 04/24/22 2039 18     Temp 04/24/22 2039 98.8 F (37.1 C)     Temp Source 04/24/22 2039 Oral     SpO2 04/24/22 2039 99 %     Weight 04/24/22 2037 205 lb 0.4 oz (93 kg)     Height 04/24/22 2037 5\' 8"  (1.727 m)     Head Circumference --      Peak Flow --      Pain Score 04/24/22 2037 2     Pain Loc --      Pain Edu? --      Excl. in  GC? --     Most recent vital signs: Vitals:   04/24/22 2039 04/24/22 2235  BP: (!) 156/76 (!) 139/94  Pulse: (!) 112 91  Resp: 18 20  Temp: 98.8 F (37.1 C) 98.8 F (37.1 C)  SpO2: 99% 96%    General: Awake, appears anxious. Skin: Warm, dry. No rashes or lesions.  Eyes: PERRL. Conjunctivae normal.  CV: Good peripheral perfusion.  S1 and S2 present.  No murmurs, rubs, or gallops. Resp: Normal effort.  Lung sounds are clear bilaterally in the apices to bases. Abd: Soft, non-tender. No distention.  Neuro: At baseline. No gross neurological deficits.   Focused Exam: N/A.  Physical Exam    ED Results / Procedures / Treatments  Labs (all labs ordered are listed, but only abnormal results are displayed) Labs Reviewed  BASIC METABOLIC PANEL - Abnormal; Notable for the following components:      Result Value   Glucose, Bld 111 (*)    All other components within normal limits  CBC - Abnormal; Notable for the following components:   WBC 11.2 (*)    All other components within normal limits  SALICYLATE LEVEL - Abnormal; Notable for the following components:   Salicylate Lvl <7.0 (*)    All other components within normal  limits  TROPONIN I (HIGH SENSITIVITY)  TROPONIN I (HIGH SENSITIVITY)     EKG Sinus tachycardia, rate of 120, no T-segment changes, no AV blocks, no axis deviations, normal QRS interval.   RADIOLOGY  ED Provider Interpretation: I personally viewed and interpreted this x-ray, no acute cardiopulmonary disease present.  DG Chest 2 View  Result Date: 04/24/2022 CLINICAL DATA:  Chest pain starting earlier today. EXAM: CHEST - 2 VIEW COMPARISON:  03/22/2022 FINDINGS: The heart size and mediastinal contours are within normal limits. Both lungs are clear. The visualized skeletal structures are unremarkable. IMPRESSION: No active cardiopulmonary disease. Electronically Signed   By: Burman Nieves M.D.   On: 04/24/2022 21:01    PROCEDURES:  Critical Care  performed: N/A.  Procedures    MEDICATIONS ORDERED IN ED: Medications - No data to display   IMPRESSION / MDM / ASSESSMENT AND PLAN / ED COURSE  I reviewed the triage vital signs and the nursing notes.                              Differential diagnosis includes, but is not limited to, pain disorder, anxiety, ACS, pericarditis, myocarditis, depression  ED Course Patient appears clinically stable, but anxious.  Vitals within normal limits.  Nontachycardic at this time.  CBC shows slight leukocytosis 11.2, otherwise unremarkable.  BMP shows no AKI or electrolyte abnormalities.  Initial troponin less than 2.  ECG unremarkable.  Highly unlikely to be ACS or myocarditis/pericarditis  Assessment/Plan Patient presents with chest pain, as well as a host of other symptoms including tactile sensations, paresthesias, hypersalivation, and feelings of anxiety/impending doom frequently.  Lab work-up has been reassuring.  EKG is unremarkable.  Very unlikely to have a cardiac pathology.  His speech is very tangential in the room and he expresses concern about several items within his blood work, as well as each of his vital signs and consistently asked for reassurance that he is not going to die.  I suspect that his symptoms are highly connected to his underlying anxiety, for which he has not been taking all of his prescribed medications for.  He states that he does not have a psychiatrist that he sees either due to lack of insurance.  We will provide him with refills on his medication, as well as a prescription for hydroxyzine to take as needed.  We will provide him with contact information to psychiatry.  Reassured the patient and advised him to touch base with his regular doctor as well.  We will plan to discharge.  Considered admission for this patient, but given his stable presentation and unremarkable work-up, he is unlikely to benefit from admission.  Provided the patient with anticipatory  guidance, return precautions, and educational material. Encouraged the patient to return to the emergency department at any time if they begin to experience any new or worsening symptoms. Patient expressed understanding and agreed with the plan.   Patient's presentation is most consistent with acute complicated illness / injury requiring diagnostic workup.       FINAL CLINICAL IMPRESSION(S) / ED DIAGNOSES   Final diagnoses:  Anxiety  Nonspecific chest pain     Rx / DC Orders   ED Discharge Orders          Ordered    hydrOXYzine (VISTARIL) 50 MG capsule  3 times daily PRN        04/24/22 2325    busPIRone (BUSPAR) 10 MG tablet  2  times daily        04/24/22 2325             Note:  This document was prepared using Dragon voice recognition software and may include unintentional dictation errors.   Varney Daily, [place] 04/24/22 2346    Chesley Noon, MD 04/25/22 [phone]

## 2022-04-24 NOTE — ED Triage Notes (Signed)
Pt presents via EMS with complaints of CP that started earlier today. Pt took 6 81mg  ASA PTA which he notes helped some of his pain - pain is not 2/10. Pt is concerned with feeling tachy - he notes not being complaint with his anxiety medication and has had decreased appetite. Denies SOB.

## 2022-04-24 NOTE — Discharge Instructions (Addendum)
-  Please schedule an appoint with your regular doctor or [facility] listed in these instructions.  -You may take the hydroxyzine as needed for acute anxiety episodes.  -Return to the emergency department anytime if you begin to experience any new or worsening symptoms.

## 2022-04-24 NOTE — ED Triage Notes (Signed)
First RN Note: Pt to ED via [facility] with c/o CP and generalized body numbness. Per EMS pt took 6 81mg  ASA due to thinking she was having a heart attack. Per EMS pt also thinks she has rabies due to increased salivation.   152/90 100HR  100% RA

## 2022-04-28 ENCOUNTER — Emergency Department
Admission: EM | Admit: 2022-04-28 | Discharge: 2022-04-29 | Disposition: A | Discharge status: Home / Self Care | Payer: Medicaid Other | Attending: Emergency Medicine | Admitting: Emergency Medicine

## 2022-04-28 ENCOUNTER — Other Ambulatory Visit: Payer: Self-pay

## 2022-04-28 DIAGNOSIS — F419 Anxiety disorder, unspecified: Secondary | ICD-10-CM | POA: Diagnosis present

## 2022-04-28 DIAGNOSIS — J45909 Unspecified asthma, uncomplicated: Secondary | ICD-10-CM | POA: Insufficient documentation

## 2022-04-28 DIAGNOSIS — F481 Depersonalization-derealization syndrome: Secondary | ICD-10-CM | POA: Insufficient documentation

## 2022-04-28 DIAGNOSIS — Z20822 Contact with and (suspected) exposure to covid-19: Secondary | ICD-10-CM | POA: Insufficient documentation

## 2022-04-28 LAB — URINE DRUG SCREEN, QUALITATIVE (ARMC ONLY)
Amphetamines, Ur Screen: NOT DETECTED
Barbiturates, Ur Screen: NOT DETECTED
Benzodiazepine, Ur Scrn: NOT DETECTED
Cannabinoid 50 Ng, Ur ~~LOC~~: NOT DETECTED
Cocaine Metabolite,Ur ~~LOC~~: NOT DETECTED
MDMA (Ecstasy)Ur Screen: NOT DETECTED
Methadone Scn, Ur: NOT DETECTED
Opiate, Ur Screen: NOT DETECTED
Phencyclidine (PCP) Ur S: NOT DETECTED
Tricyclic, Ur Screen: NOT DETECTED

## 2022-04-28 LAB — COMPREHENSIVE METABOLIC PANEL
ALT: 29 U/L (ref 0–44)
AST: 26 U/L (ref 15–41)
Albumin: 5.3 g/dL — ABNORMAL HIGH (ref 3.5–5.0)
Alkaline Phosphatase: 66 U/L (ref 38–126)
Anion gap: 9 (ref 5–15)
BUN: 10 mg/dL (ref 6–20)
CO2: 28 mmol/L (ref 22–32)
Calcium: 9.8 mg/dL (ref 8.9–10.3)
Chloride: 105 mmol/L (ref 98–111)
Creatinine, Ser: 0.74 mg/dL (ref 0.61–1.24)
GFR, Estimated: >60 mL/min (ref >60)
Glucose, Bld: 94 mg/dL (ref 70–99)
Potassium: 3.6 mmol/L (ref 3.5–5.1)
Sodium: 142 mmol/L (ref 135–145)
Total Bilirubin: 1.6 mg/dL — ABNORMAL HIGH (ref 0.3–1.2)
Total Protein: 8 g/dL (ref 6.5–8.1)

## 2022-04-28 LAB — SALICYLATE LEVEL: Salicylate Lvl: <7 mg/dL — ABNORMAL LOW (ref 7.0–30.0)

## 2022-04-28 LAB — CBC
HCT: 47.3 % (ref 39.0–52.0)
Hemoglobin: 16.2 g/dL (ref 13.0–17.0)
MCH: 29.2 pg (ref 26.0–34.0)
MCHC: 34.2 g/dL (ref 30.0–36.0)
MCV: 85.4 fL (ref 80.0–100.0)
Platelets: 330 10*3/uL (ref 150–400)
RBC: 5.54 MIL/uL (ref 4.22–5.81)
RDW: 13.9 % (ref 11.5–15.5)
WBC: 7.7 10*3/uL (ref 4.0–10.5)
nRBC: 0 % (ref 0.0–0.2)

## 2022-04-28 LAB — RESP PANEL BY RT-PCR (FLU A&B, COVID) ARPGX2
Influenza A by PCR: NEGATIVE
Influenza B by PCR: NEGATIVE
SARS Coronavirus 2 by RT PCR: NEGATIVE

## 2022-04-28 LAB — ACETAMINOPHEN LEVEL: Acetaminophen (Tylenol), Serum: <10 ug/mL — ABNORMAL LOW (ref 10–30)

## 2022-04-28 LAB — ETHANOL: Alcohol, Ethyl (B): <10 mg/dL (ref <10)

## 2022-04-28 MED ORDER — FLUVOXAMINE MALEATE 50 MG PO TABS
100.0000 mg | ORAL_TABLET | Freq: Two times a day (BID) | ORAL | Status: DC
  Filled 2022-04-28: qty 2

## 2022-04-28 MED ORDER — ALBUTEROL SULFATE HFA 108 (90 BASE) MCG/ACT IN AERS: 2.0000 | INHALATION_SPRAY | Freq: Four times a day (QID) | RESPIRATORY_TRACT | Status: DC | PRN

## 2022-04-28 MED ORDER — HYDROXYZINE HCL 25 MG PO TABS: 50.0000 mg | ORAL_TABLET | Freq: Three times a day (TID) | ORAL | Status: DC | PRN

## 2022-04-28 MED ORDER — PANTOPRAZOLE SODIUM 40 MG PO TBEC: 40.0000 mg | DELAYED_RELEASE_TABLET | Freq: Every day | ORAL | Status: DC

## 2022-04-28 MED ORDER — BUSPIRONE HCL 5 MG PO TABS: 10.0000 mg | ORAL_TABLET | Freq: Two times a day (BID) | ORAL | Status: DC

## 2022-04-28 NOTE — BH Assessment (Signed)
Comprehensive Clinical Assessment (CCA) Note  04/28/2022 Gerald Olsen [id number]  Chief Complaint:  Chief Complaint  Patient presents with   Psychiatric Evaluation   Visit Diagnosis: Anxiety   Gerald Olsen "Gerald Olsen" is a 26 year old and was advised to come to the ER after they were seen at the Urgent Care. Patient states, they have had an increase in anxiety. Patient states, things increased after they smoked some cannabis approximately three months ago. Per their report, that was the second time they have ever tried it. During the interview, the patient was calm, cooperative and pleasant. They were able to provide appropriate answers to the questions. Throughout the interview, they denied SI/HI and AV/H. They are already working on establishing outpatient treatment with [facility].  CCA Screening, Triage and Referral (STR)  Patient Reported Information How did you hear about [place]? Self  What Is the Reason for Your Visit/Call Today? Patient was advised to come to the ER, after he was seen at the Urgent Care for anxiety.  How Long Has This Been Causing You Problems? 1-6 months  What Do You Feel Would Help You the Most Today? Treatment for Depression or other mood problem   Have You Recently Had Any Thoughts About Hurting Yourself? No  Are You Planning to Commit Suicide/Harm Yourself At This time? No   Have you Recently Had Thoughts About Hurting Someone Gerald Olsen? No  Are You Planning to Harm Someone at This Time? No  Explanation: No data recorded  Have You Used Any Alcohol or Drugs in the Past 24 Hours? No  How Long Ago Did You Use Drugs or Alcohol? No data recorded What Did You Use and How Much? No data recorded  Do You Currently Have a Therapist/Psychiatrist? Yes  Name of Therapist/Psychiatrist: [facility].   Have You Been Recently Discharged From Any Office Practice or Programs? No  Explanation of Discharge From Practice/Program: No data  recorded    CCA Screening Triage Referral Assessment Type of Contact: Face-to-Face  Telemedicine Service Delivery:   Is this Initial or Reassessment? No data recorded Date Telepsych consult ordered in [facility]:  No data recorded Time Telepsych consult ordered in [facility]:  No data recorded Location of Assessment: [facility] ED  Provider Location: [facility] ED   Collateral Involvement: No data recorded  Does Patient Have a Court Appointed Legal Guardian? No data recorded Name and Contact of Legal Guardian: No data recorded If Minor and Not Living with Parent(s), Who has Custody? No data recorded Is CPS involved or ever been involved? Never  Is APS involved or ever been involved? Never   Patient Determined To Be At Risk for Harm To Self or Others Based on Review of Patient Reported Information or Presenting Complaint? No  Method: No data recorded Availability of Means: No data recorded Intent: No data recorded Notification Required: No data recorded Additional Information for Danger to Others Potential: No data recorded Additional Comments for Danger to Others Potential: No data recorded Are There Guns or Other Weapons in Your Home? No data recorded Types of Guns/Weapons: No data recorded Are These Weapons Safely Secured?                            No data recorded Who Could Verify You Are Able To Have These Secured: No data recorded Do You Have any Outstanding Charges, Pending Court Dates, Parole/Probation? No data recorded Contacted To Inform of Risk of Harm To  Self or Others: No data recorded   Does Patient Present under Involuntary Commitment? No  IVC Papers Initial File Date: No data recorded  [place] of Residence: [place]   Patient Currently Receiving the Following Services: Individual Therapy   Determination of Need: Emergent (2 hours)   Options For Referral: ED Visit     CCA Biopsychosocial Patient Reported Schizophrenia/Schizoaffective Diagnosis in Past:  No   Strengths: No data recorded  Mental Health Symptoms Depression:   Sleep (too much or little); Fatigue; Hopelessness   Duration of Depressive symptoms:  Duration of Depressive Symptoms: Greater than two weeks   Mania:   Racing thoughts   Anxiety:    Difficulty concentrating; Tension; Worrying; Restlessness; Sleep   Psychosis:   None   Duration of Psychotic symptoms:    Trauma:   N/A   Obsessions:   N/A   Compulsions:   N/A   Inattention:   N/A   Hyperactivity/Impulsivity:   N/A   Oppositional/Defiant Behaviors:   N/A   Emotional Irregularity:   N/A   Other Mood/Personality Symptoms:  No data recorded   Mental Status Exam Appearance and self-care  Stature:   Average   Weight:   Average weight   Clothing:   Neat/clean; Age-appropriate   Grooming:   Normal   Cosmetic use:   None   Posture/gait:   Normal   Motor activity:   -- (Within normal range)   Sensorium  Attention:   Distractible   Concentration:   Scattered   Orientation:   X5   Recall/memory:   Normal   Affect and Mood  Affect:   Anxious; Full Range   Mood:   Anxious   Relating  Eye contact:   Normal   Facial expression:   Anxious; Responsive   Attitude toward examiner:   Cooperative   Thought and Language  Speech flow:  Clear and Coherent   Thought content:   Appropriate to Mood and Circumstances   Preoccupation:   Ruminations   Hallucinations:   None   Organization:  No data recorded  [employer] of Knowledge:   Fair   Intelligence:   Average   Abstraction:   Functional   Judgement:   Fair   Dance movement psychotherapist:   Realistic   Insight:   Fair   Decision Making:   Normal   Social Functioning  Social Maturity:   Isolates; Responsible   Social Judgement:   Chemical engineer"; Normal   Stress  Stressors:   Transitions; Other (Comment)   Coping Ability:   Overwhelmed   Skill Deficits:   None   Supports:    Family; Friends/Service system     Religion: Religion/Spirituality Are You A Religious Person?: No  Leisure/Recreation: Leisure / Recreation Do You Have Hobbies?: No  Exercise/Diet: Exercise/Diet Do You Exercise?: No Have You Gained or Lost A Significant Amount of Weight in the Past Six Months?: No Do You Follow a Special Diet?: No Do You Have Any Trouble Sleeping?: Yes   CCA Employment/Education Employment/Work Situation: Employment / Work Situation Employment Situation: Unemployed Has Patient ever Been in Equities trader?: No  Education: Education Is Patient Currently Attending School?: No Did You Have An Individualized Education Program (IIEP): No Did You Have Any Difficulty At [employer]?: No Patient's Education Has Been Impacted by Current Illness: No   CCA Family/Childhood History Family and Relationship History: Family history Marital status: Single Does patient have children?: No  Childhood History:  Childhood History By whom was/is the  patient raised?: Both parents Did patient suffer any verbal/emotional/physical/sexual abuse as a child?: No Did patient suffer from severe childhood neglect?: No Has patient ever been sexually abused/assaulted/raped as an adolescent or adult?: No Was the patient ever a victim of a crime or a disaster?: No Witnessed domestic violence?: No Has patient been affected by domestic violence as an adult?: No  Child/Adolescent Assessment:     CCA Substance Use Alcohol/Drug Use: Alcohol / Drug Use Pain Medications: See PTA Prescriptions: See PTA Over the Counter: See PTA History of alcohol / drug use?: Yes Longest period of sobriety (when/how long): Unable to quantify Substance #1 Name of Substance 1: Alcohol Substance #2 Name of Substance 2: Cannabis 2 - Frequency: Twice in life time 2 - Last Use / Amount: Three months ago.                     ASAM's:  Six Dimensions of Multidimensional Assessment  Dimension  1:  Acute Intoxication and/or Withdrawal Potential:      Dimension 2:  Biomedical Conditions and Complications:      Dimension 3:  Emotional, Behavioral, or Cognitive Conditions and Complications:     Dimension 4:  Readiness to Change:     Dimension 5:  Relapse, Continued use, or Continued Problem Potential:     Dimension 6:  Recovery/Living Environment:     ASAM Severity Score:    ASAM Recommended Level of Treatment:     Substance use Disorder (SUD)    Recommendations for Services/Supports/Treatments:    Discharge Disposition:    DSM5 Diagnoses: Patient Active Problem List   Diagnosis Date Noted   Depression 04/15/2019   Chest pain 04/09/2013     Referrals to Alternative Service(s): Referred to Alternative Service(s):   Place:   Date:   Time:    Referred to Alternative Service(s):   Place:   Date:   Time:    Referred to Alternative Service(s):   Place:   Date:   Time:    Referred to Alternative Service(s):   Place:   Date:   Time:     Lilyan Gilford MS, LCAS, [facility], [facility] Therapeutic Triage Specialist 04/28/2022 9:31 PM

## 2022-04-28 NOTE — ED Notes (Signed)
Patient transferred from Triage to room 23 after dressing out and screening for contraband. Report received from April, RN including situation, background, assessment and recommendations. Pt oriented to [employer] including Q15 minute rounds as well as Psychologist, counselling for their protection. Patient is alert and oriented, warm and dry in no acute distress. Patient denies SI, HI, and AVH. Pt. Encouraged to let this nurse know if needs arise.

## 2022-04-28 NOTE — ED Notes (Signed)
First Nurse Note: Patient sent to ED from [facility] for anxiety. Per NP at [facility], patient has stopped taking certain anxiety medications. NP would like patient to get a psych evaluation. Patient states to this RN "I'm on the verge of a mental breakdown."

## 2022-04-28 NOTE — ED Triage Notes (Signed)
Pt states was sent here for mental health evaluation. Pt states has anxiety and "feel not like myself". Pt is anxious in triage, denies SI or HI.

## 2022-04-28 NOTE — ED Notes (Signed)
Pt dressed out in triage the following placed in labeled bag: brown sweater, tie tye t shirt, grey sweatpants, grey crocs.

## 2022-04-28 NOTE — ED Notes (Signed)
Pt states that prtonix is when needed and luvox has been stopped by self. States other meds make self very sleepy and is best to wait until after plan is developed.

## 2022-04-28 NOTE — ED Provider Notes (Addendum)
[facility] Provider Note    Event Date/Time   First MD Initiated Contact with Patient 04/28/22 1916     (approximate)   History   Psychiatric Evaluation   HPI  Gerald Olsen is a 26 y.o. adult with a past medical history of ADHD anxiety and asthma as well as Tourette's who presents for evaluation of worsening anxiety and sensation of depersonalization.  Symptoms started about 3 months ago and patient reports smoking a joint that supposedly contain some fentanyl.  Since then patient has struggled with depersonalization and worsening anxiety.  Patient denies any recent subsequent illegal drug use, EtOH use or any physical symptoms today that are new today.  Patient does have baseline tics from Tourette's.  Reports compliance with BuSpar but not fluvoxamine.     Past Medical History:  Diagnosis Date   ADHD    Anxiety    Asthma    Chest pain    Tourette's      Physical Exam  Triage Vital Signs: ED Triage Vitals [04/28/22 1859]  Enc Vitals Group     BP 135/84     Pulse Rate 82     Resp 18     Temp 98 F (36.7 C)     Temp Source Oral     SpO2 98 %     Weight 205 lb 0.4 oz (93 kg)     Height 5\' 8"  (1.727 m)     Head Circumference      Peak Flow      Pain Score 0     Pain Loc      Pain Edu?      Excl. in GC?     Most recent vital signs: Vitals:   04/28/22 1859  BP: 135/84  Pulse: 82  Resp: 18  Temp: 98 F (36.7 C)  SpO2: 98%    General: Awake, no distress.  Seems uncomfortable. CV:  Good peripheral perfusion.  Resp:  Normal effort.  Abd:  No distention.  Other:  Patient does seem dysmorphic.  Does not seem clinically intoxicated or psychotic.   ED Results / Procedures / Treatments  Labs (all labs ordered are listed, but only abnormal results are displayed) Labs Reviewed  COMPREHENSIVE METABOLIC PANEL - Abnormal; Notable for the following components:      Result Value   Albumin 5.3 (*)    Total Bilirubin 1.6 (*)    All  other components within normal limits  SALICYLATE LEVEL - Abnormal; Notable for the following components:   Salicylate Lvl <7.0 (*)    All other components within normal limits  ACETAMINOPHEN LEVEL - Abnormal; Notable for the following components:   Acetaminophen (Tylenol), Serum <10 (*)    All other components within normal limits  RESP PANEL BY RT-PCR (FLU A&B, COVID) ARPGX2  ETHANOL  CBC  URINE DRUG SCREEN, QUALITATIVE ([facility] ONLY)     EKG    RADIOLOGY   PROCEDURES:  Critical Care performed: No  Procedures   MEDICATIONS ORDERED IN ED: Medications  albuterol (VENTOLIN HFA) 108 (90 Base) MCG/ACT inhaler 2 puff (has no administration in time range)  pantoprazole (PROTONIX) EC tablet 40 mg (40 mg Oral Patient Refused/Not Given 04/28/22 2143)  hydrOXYzine (ATARAX) tablet 50 mg (has no administration in time range)  busPIRone (BUSPAR) tablet 10 mg (has no administration in time range)  fluvoxaMINE (LUVOX) tablet 100 mg (100 mg Oral Patient Refused/Not Given 04/28/22 2142)     IMPRESSION / MDM / ASSESSMENT  AND PLAN / ED COURSE  I reviewed the triage vital signs and the nursing notes. Patient's presentation is most consistent with severe exacerbation of chronic illness.                               Differential diagnosis includes, but is not limited to worsening anxiety and Tourette's syndrome with possibly an additional underlying psychiatric illness.  I have a low suspicion for significant metabolic derangement, thyroid derangement or physical injury at this time.  Psychiatry TTS consulted.  Home medications reordered.  CMP without significant electrolyte or metabolic derangements.  Acetaminophen, salicylate and ethanol levels undetectable.  CBC without leukocytosis or acute anemia.  COVID screen negative.  UDS negative.  The patient has been placed in psychiatric observation due to the need to provide a safe environment for the patient while obtaining psychiatric  consultation and evaluation, as well as ongoing medical and medication management to treat the patient's condition.  The patient has not been placed under full IVC at this time.  Patient seen and cleared for discharge by psychiatry.  Patient will follow-up with outpatient providers.  Also given address and phone number for GI follow-up.       FINAL CLINICAL IMPRESSION(S) / ED DIAGNOSES   Final diagnoses:  Anxiety  Depersonalization (HCC)     Rx / DC Orders   ED Discharge Orders     None        Note:  This document was prepared using Dragon voice recognition software and may include unintentional dictation errors.   Gilles Chiquito, MD 04/28/22 2316    Gilles Chiquito, MD 04/28/22 [phone]

## 2022-05-01 ENCOUNTER — Emergency Department (HOSPITAL_COMMUNITY)
Admission: EM | Admit: 2022-05-01 | Discharge: 2022-05-02 | Disposition: A | Discharge status: Home / Self Care | Payer: Medicaid Other | Attending: Emergency Medicine | Admitting: Emergency Medicine

## 2022-05-01 ENCOUNTER — Encounter (HOSPITAL_COMMUNITY): Payer: Self-pay | Admitting: Emergency Medicine

## 2022-05-01 DIAGNOSIS — R1310 Dysphagia, unspecified: Secondary | ICD-10-CM | POA: Insufficient documentation

## 2022-05-01 DIAGNOSIS — R0989 Other specified symptoms and signs involving the circulatory and respiratory systems: Secondary | ICD-10-CM

## 2022-05-01 DIAGNOSIS — Z79899 Other long term (current) drug therapy: Secondary | ICD-10-CM | POA: Insufficient documentation

## 2022-05-01 LAB — CBC WITH DIFFERENTIAL/PLATELET
Abs Immature Granulocytes: 0.02 10*3/uL (ref 0.00–0.07)
Basophils Absolute: 0 10*3/uL (ref 0.0–0.1)
Basophils Relative: 1 %
Eosinophils Absolute: 0.5 10*3/uL (ref 0.0–0.5)
Eosinophils Relative: 7 %
HCT: 47 % (ref 39.0–52.0)
Hemoglobin: 16.4 g/dL (ref 13.0–17.0)
Immature Granulocytes: 0 %
Lymphocytes Relative: 29 %
Lymphs Abs: 2 10*3/uL (ref 0.7–4.0)
MCH: 29.5 pg (ref 26.0–34.0)
MCHC: 34.9 g/dL (ref 30.0–36.0)
MCV: 84.5 fL (ref 80.0–100.0)
Monocytes Absolute: 0.5 10*3/uL (ref 0.1–1.0)
Monocytes Relative: 8 %
Neutro Abs: 3.8 10*3/uL (ref 1.7–7.7)
Neutrophils Relative %: 55 %
Platelets: 349 10*3/uL (ref 150–400)
RBC: 5.56 MIL/uL (ref 4.22–5.81)
RDW: 13.5 % (ref 11.5–15.5)
WBC: 7 10*3/uL (ref 4.0–10.5)
nRBC: 0 % (ref 0.0–0.2)

## 2022-05-01 LAB — COMPREHENSIVE METABOLIC PANEL
ALT: 26 U/L (ref 0–44)
AST: 21 U/L (ref 15–41)
Albumin: 4.7 g/dL (ref 3.5–5.0)
Alkaline Phosphatase: 66 U/L (ref 38–126)
Anion gap: 7 (ref 5–15)
BUN: 13 mg/dL (ref 6–20)
CO2: 24 mmol/L (ref 22–32)
Calcium: 9.9 mg/dL (ref 8.9–10.3)
Chloride: 106 mmol/L (ref 98–111)
Creatinine, Ser: 0.64 mg/dL (ref 0.61–1.24)
GFR, Estimated: >60 mL/min (ref >60)
Glucose, Bld: 94 mg/dL (ref 70–99)
Potassium: 4 mmol/L (ref 3.5–5.1)
Sodium: 137 mmol/L (ref 135–145)
Total Bilirubin: 0.9 mg/dL (ref 0.3–1.2)
Total Protein: 7.4 g/dL (ref 6.5–8.1)

## 2022-05-01 LAB — URINALYSIS, ROUTINE W REFLEX MICROSCOPIC
Bilirubin Urine: NEGATIVE
Glucose, UA: NEGATIVE mg/dL
Hgb urine dipstick: NEGATIVE
Ketones, ur: NEGATIVE mg/dL
Leukocytes,Ua: NEGATIVE
Nitrite: NEGATIVE
Protein, ur: 30 mg/dL — AB
Specific Gravity, Urine: 1.023 (ref 1.005–1.030)
pH: 9 — ABNORMAL HIGH (ref 5.0–8.0)

## 2022-05-01 LAB — RAPID URINE DRUG SCREEN, HOSP PERFORMED
Amphetamines: NOT DETECTED
Barbiturates: NOT DETECTED
Benzodiazepines: NOT DETECTED
Cocaine: NOT DETECTED
Opiates: NOT DETECTED
Tetrahydrocannabinol: NOT DETECTED

## 2022-05-01 LAB — TROPONIN I (HIGH SENSITIVITY)
Troponin I (High Sensitivity): <2 ng/L (ref <18)
Troponin I (High Sensitivity): <2 ng/L (ref <18)

## 2022-05-01 MED ORDER — IBUPROFEN 400 MG PO TABS
600.0000 mg | ORAL_TABLET | Freq: Once | ORAL | Status: AC
  Administered 2022-05-01: 600 mg via ORAL
  Filled 2022-05-01: qty 1

## 2022-05-01 NOTE — ED Triage Notes (Signed)
Patient here with compliant of difficulty swallowing that started three months ago.

## 2022-05-01 NOTE — ED Provider Triage Note (Signed)
Emergency Medicine Provider Triage Evaluation Note  Gerald Olsen , a 26 y.o. adult  was evaluated in triage.  Pt complains of difficulty swallowing.  Patient states that they have lost 50+ pounds over the past 3 months due to not eating due to dysphagia.  Patient states he been evaluated with a swallow study before which was grossly normal.  Patient also complains of intermittent chest discomfort related leaves from the lack of nutrition.  Patient also has concerns over using marijuana possibly laced with fentanyl sometime in the past 2 weeks  Review of Systems  Positive: As above Negative: As above  Physical Exam  BP 115/72 (BP Location: Right Arm)   Pulse 95   Temp 99.2 F (37.3 C) (Oral)   Resp 16   SpO2 97%  Gen:   Awake, no distress   Resp:  Normal effort  MSK:   Moves extremities without difficulty  Other:    Medical Decision Making  Medically screening exam initiated at 6:50 PM.  Appropriate orders placed.  Gerald Olsen was informed that the remainder of the evaluation will be completed by another provider, this initial triage assessment does not replace that evaluation, and the importance of remaining in the ED until their evaluation is complete.     Gerald Grinder, PA-C 05/01/22 1851

## 2022-05-02 ENCOUNTER — Other Ambulatory Visit: Payer: Self-pay

## 2022-05-02 NOTE — ED Provider Notes (Signed)
[facility] EMERGENCY DEPARTMENT Provider Note   CSN: [id number] Arrival date & time: 05/01/22  1828     History  Chief Complaint  Patient presents with   Dysphagia    Gerald Olsen is a 26 y.o. adult.  26 year old transgender male presents to the emergency department for evaluation of globus sensation.  She has been experiencing this for the past 3 months and states that she feels as though there is something in her throat when she is trying to swallow.  As a result, she has only been consuming protein shakes.  Has lost 50 to 75 pounds secondary to dietary changes.  Symptoms all began after patient smoked marijuana laced with fentanyl.  She has been seen for these complaints numerous times, mostly at [facility].  Does endorse a history of anxiety, but does not have a psychiatrist.  Is presently having her mental health diagnoses managed by her primary care doctor.  The history is provided by the patient. No language interpreter was used.       Home Medications Prior to Admission medications   Medication Sig Start Date End Date Taking? Authorizing Provider  albuterol (VENTOLIN HFA) 108 (90 Base) MCG/ACT inhaler Inhale 2 puffs into the lungs every 6 (six) hours as needed for wheezing or shortness of breath. 02/28/22   Chesley Noon, MD  busPIRone (BUSPAR) 10 MG tablet Take 1 tablet (10 mg total) by mouth 2 (two) times daily. 04/24/22 05/24/22  Varney Daily, PA  fluvoxaMINE (LUVOX) 100 MG tablet Take 100 mg by mouth 2 (two) times daily. 01/04/22   [provider]  hydrOXYzine (VISTARIL) 50 MG capsule Take 1 capsule (50 mg total) by mouth 3 (three) times daily as needed. 04/24/22 05/24/22  Varney Daily, PA  LORazepam (ATIVAN) 0.5 MG tablet Take 0.5 mg by mouth daily as needed. 01/13/22   [provider]  pantoprazole (PROTONIX) 20 MG tablet Take 1 tablet (20 mg total) by mouth daily for 14 days. 02/08/22 02/22/22   Concha Se, MD  phenol (CHLORASEPTIC MOUTH PAIN) 1.4 % LIQD Use as directed 1 spray in the mouth or throat as needed for throat irritation / pain. 02/11/22   Merwyn Katos, MD  PROTONIX 40 MG tablet Take 40 mg by mouth daily. 02/09/22   [provider]      Allergies    Shrimp (diagnostic)    Review of Systems   Review of Systems Ten systems reviewed and are negative for acute change, except as noted in the HPI.    Physical Exam Updated Vital Signs BP 128/72   Pulse 72   Temp 98.2 F (36.8 C)   Resp 15   SpO2 98%   Physical Exam Vitals and nursing note reviewed.  Constitutional:      General: She is not in acute distress.    Appearance: She is well-developed. She is not diaphoretic.     Comments: Nontoxic-appearing and in no acute distress  HENT:     Head: Normocephalic and atraumatic.     Mouth/Throat:     Comments: Tolerating secretions without difficulty.  No tripoding or stridor.  Normal phonation. Eyes:     General: No scleral icterus.    Conjunctiva/sclera: Conjunctivae normal.  Pulmonary:     Effort: Pulmonary effort is normal. No respiratory distress.  Musculoskeletal:        General: Normal range of motion.     Cervical back: Normal range of motion.  Skin:  General: Skin is warm and dry.     Coloration: Skin is not pale.     Findings: No erythema or rash.  Neurological:     Mental Status: She is alert and oriented to person, place, and time.  Psychiatric:        Behavior: Behavior normal.     ED Results / Procedures / Treatments   Labs (all labs ordered are listed, but only abnormal results are displayed) Labs Reviewed  URINALYSIS, ROUTINE W REFLEX MICROSCOPIC - Abnormal; Notable for the following components:      Result Value   APPearance CLOUDY (*)    pH 9.0 (*)    Protein, ur 30 (*)    Bacteria, UA RARE (*)    All other components within normal limits  COMPREHENSIVE METABOLIC PANEL  CBC WITH DIFFERENTIAL/PLATELET  RAPID URINE  DRUG SCREEN, HOSP PERFORMED  TROPONIN I (HIGH SENSITIVITY)  TROPONIN I (HIGH SENSITIVITY)    EKG None  Radiology No results found.  Procedures Procedures    Medications Ordered in ED Medications  ibuprofen (ADVIL) tablet 600 mg (600 mg Oral Given 05/01/22 2105)    ED Course/ Medical Decision Making/ A&P                           Medical Decision Making  This patient presents to the ED for concern of globus sensation, this involves an extensive number of treatment options, and is a complaint that carries with it a high risk of complications and morbidity.  The differential diagnosis includes GAD vs food impaction vs esophageal stricture   Co morbidities that complicate the patient evaluation  Anxiety    Additional history obtained:  Additional history obtained from mother External records from outside source obtained and reviewed including swallow screen from 02/2022, MRI brain w/w/o contrast from 04/10/22   Lab Tests:  I Ordered, and personally interpreted labs.  The pertinent results include:  normal CBC, CMP, and troponin, negative UDS   Cardiac Monitoring:  The patient was maintained on a cardiac monitor.  I personally viewed and interpreted the cardiac monitored which showed an underlying rhythm of: NSR   Test Considered:  CT soft tissue neck   Reevaluation:  After the interventions noted above, I reevaluated the patient and found that they have :stayed the same   Social Determinants of Health:  Insured   Dispostion:  After consideration of the diagnostic results and the patients response to treatment, I feel that the patent would benefit from outpatient psychiatric care.  I have had an extensive conversation regarding her symptoms as well as reassuring results from prior imaging studies and labs.  While the patient has had weight loss, this is related to dietary changes and she does not appear acutely malnourished.  Labs have been  normal.  Recommended primary care follow-up until patient is able to become established with a psychiatrist.  Return precautions discussed and provided. Patient discharged in stable condition with no unaddressed concerns.         Final Clinical Impression(s) / ED Diagnoses Final diagnoses:  Globus sensation    Rx / DC Orders ED Discharge Orders     None         Antony Madura, PA-C 05/02/22 0431    Tilden Fossa, MD 05/02/22 [phone]

## 2022-05-02 NOTE — Discharge Instructions (Addendum)
We have extensively reviewed your history as well as your prior imaging studies which have all been reassuring.  Your blood work in the ED today was normal.  Your myriad of symptoms can be explained by significant, untreated anxiety disorder.  This often requires intensive therapy with a psychiatrist and may require use of psychiatric medications.  You have been given a resource guide for local counseling services.  In the interim, we strongly advise follow-up with your primary doctor.  You may return to the ED for new or concerning symptoms.

## 2022-05-04 DIAGNOSIS — F410 Panic disorder [episodic paroxysmal anxiety] without agoraphobia: Secondary | ICD-10-CM | POA: Insufficient documentation

## 2022-05-04 DIAGNOSIS — R0602 Shortness of breath: Secondary | ICD-10-CM | POA: Diagnosis present

## 2022-05-05 ENCOUNTER — Encounter: Payer: Self-pay | Admitting: Emergency Medicine

## 2022-05-05 ENCOUNTER — Emergency Department: Payer: Medicaid Other

## 2022-05-05 ENCOUNTER — Emergency Department
Admission: EM | Admit: 2022-05-05 | Discharge: 2022-05-05 | Disposition: A | Discharge status: Home / Self Care | Payer: Medicaid Other | Attending: Emergency Medicine | Admitting: Emergency Medicine

## 2022-05-05 DIAGNOSIS — R0789 Other chest pain: Secondary | ICD-10-CM

## 2022-05-05 DIAGNOSIS — F410 Panic disorder [episodic paroxysmal anxiety] without agoraphobia: Secondary | ICD-10-CM

## 2022-05-05 LAB — BASIC METABOLIC PANEL
Anion gap: 6 (ref 5–15)
BUN: 14 mg/dL (ref 6–20)
CO2: 25 mmol/L (ref 22–32)
Calcium: 9.8 mg/dL (ref 8.9–10.3)
Chloride: 107 mmol/L (ref 98–111)
Creatinine, Ser: 0.65 mg/dL (ref 0.61–1.24)
GFR, Estimated: >60 mL/min (ref >60)
Glucose, Bld: 96 mg/dL (ref 70–99)
Potassium: 4.1 mmol/L (ref 3.5–5.1)
Sodium: 138 mmol/L (ref 135–145)

## 2022-05-05 LAB — CBC
HCT: 44.1 % (ref 39.0–52.0)
Hemoglobin: 15.3 g/dL (ref 13.0–17.0)
MCH: 28.9 pg (ref 26.0–34.0)
MCHC: 34.7 g/dL (ref 30.0–36.0)
MCV: 83.4 fL (ref 80.0–100.0)
Platelets: 313 10*3/uL (ref 150–400)
RBC: 5.29 MIL/uL (ref 4.22–5.81)
RDW: 13.3 % (ref 11.5–15.5)
WBC: 6.9 10*3/uL (ref 4.0–10.5)
nRBC: 0 % (ref 0.0–0.2)

## 2022-05-05 LAB — TROPONIN I (HIGH SENSITIVITY)
Troponin I (High Sensitivity): <2 ng/L (ref <18)
Troponin I (High Sensitivity): <2 ng/L (ref <18)

## 2022-05-05 NOTE — Discharge Instructions (Addendum)
You have been seen in the Emergency Department (ED) today for chest pain and related symptoms.  As we have discussed today's test results are normal, and we feel it is likely that panic attacks may be causing your symptoms.  Please follow up with the recommended doctor as instructed above in these documents regarding today's emergent visit and your recent symptoms to discuss further management.  Continue to take your regular medications. If you are not doing so already, consider taking a daily baby aspirin (81 mg), at least until you follow up with your doctor.  Return to the Emergency Department (ED) if you experience any further chest pain/pressure/tightness, difficulty breathing, or sudden sweating, or other symptoms that concern you.

## 2022-05-05 NOTE — ED Triage Notes (Signed)
Pt presents via POV with complaints of left sided CP with radiation to the left arm. Pt states that the discomfort started about 15 mins ago and has resolved since arriving. He notes taking 4 ASA, 50mg  Vistaril, and 100mg  Fluvoxamine PTA. Denies fevers, N/V, SOB.

## 2022-05-05 NOTE — ED Notes (Signed)
E-signature not working at this time. Pt verbalized understanding of D/C instructions, prescriptions and follow up care with no further questions at this time. Pt in NAD and ambulatory at time of D/C.  

## 2022-05-05 NOTE — ED Provider Notes (Signed)
[facility] Provider Note    Event Date/Time   First MD Initiated Contact with Patient 05/05/22 [phone]     (approximate)   History   Chest Pain   HPI  Gerald Olsen is a 26 y.o. adult who identifies as male and presents for evaluation of chest pain and tightening as well as shortness of breath.  This has happened repeatedly in the past but she said that it usually is not so intense.  She acknowledges that she suffers from anxiety but she believes this feels different from an anxiety attack, and she comments that she was not under any particular stress or acute situation at that time.  The symptoms have been fairly consistent for an extended period of time and she states she is used to chest pain, but the feeling of tightness and shortness of breath is different.  She reports that she does not see a therapist.  She was surprised to learn that this is her 18th emergency department visit in 6 months.  She said that a few months ago she tried fentanyl and another drug and since then she has been "depersonalized" and not feeling like herself.    She denies fever and abdominal pain.  However, she states that she has been losing weight and claims that she has not eaten in 4 months and wonders if perhaps that could be part of the problem.     Physical Exam   Triage Vital Signs: ED Triage Vitals  Enc Vitals Group     BP 05/05/22 0002 (!) 152/78     Pulse Rate 05/05/22 0002 95     Resp 05/05/22 0002 18     Temp 05/05/22 0002 98.8 F (37.1 C)     Temp Source 05/05/22 0002 Oral     SpO2 05/05/22 0002 99 %     Weight 05/05/22 0001 92 kg (202 lb 13.2 oz)     Height 05/05/22 0001 1.727 m (5\' 8" )     Head Circumference --      Peak Flow --      Pain Score 05/05/22 0001 0     Pain Loc --      Pain Edu? --      Excl. in GC? --     Most recent vital signs: Vitals:   05/05/22 0321 05/05/22 0654  BP: 137/77 128/86  Pulse: 88 81  Resp: 18 17  Temp: 98.1 F  (36.7 C) 98.1 F (36.7 C)  SpO2: 98% 100%     General: Awake, no distress.  CV:  Good peripheral perfusion.  Normal heart sounds. Resp:  Normal effort.  Lungs are clear to auscultation bilaterally. Abd:  No distention.  Other:  Patient is anxious.  She asked me questions about her results and asked me if I think she is going to die and wants/needs reassurance about her evaluation.   ED Results / Procedures / Treatments   Labs (all labs ordered are listed, but only abnormal results are displayed) Labs Reviewed  BASIC METABOLIC PANEL  CBC  TROPONIN I (HIGH SENSITIVITY)  TROPONIN I (HIGH SENSITIVITY)     EKG  ED ECG REPORT I, 07/05/22, the attending physician, personally viewed and interpreted this ECG.  Date: 05/05/2022 EKG Time: 00: 04 Rate: 92 Rhythm: normal sinus rhythm QRS Axis: normal Intervals: Incomplete right bundle branch block ST/T Wave abnormalities: Non-specific ST segment / T-wave changes, but no clear evidence of acute ischemia. Narrative Interpretation: no definitive evidence  of acute ischemia; does not meet STEMI criteria.    RADIOLOGY I viewed and interpreted the patient's two-view chest x-ray.  There is no evidence of acute abnormality such as pneumonia or pneumothorax.  I also read the radiologist's report, which confirmed no acute findings.    PROCEDURES:  Critical Care performed: No  Procedures   MEDICATIONS ORDERED IN ED: Medications - No data to display   IMPRESSION / MDM / ASSESSMENT AND PLAN / ED COURSE  I reviewed the triage vital signs and the nursing notes.                              Differential diagnosis includes, but is not limited to, anxiety/panic attack, nonspecific atypical chest pain, pneumonia, pneumothorax, ACS, PE.  Patient's presentation is most consistent with acute presentation with potential threat to life or bodily function.  However, the patient's examination is reassuring.  Vital signs are stable and  within normal limits.  Labs/studies ordered include EKG, two-view chest x-ray, basic metabolic panel, CBC, high-sensitivity troponin x 2.  All of the labs are within normal limits, no ischemia on EKG, normal chest x-ray.  I reviewed the medical record and see that this is her 18th visit in 6 months.  I reminded her of that and provided reassurance to the best my ability.  She confirmed that she is not seeing a therapist and I strongly encouraged her to do so.  There is no evidence that she is suffering from malnutrition or electrolyte abnormality as 1 might expect after 4 months of not eating.  I suspect she would benefit greatly from psychiatric consultation but she meets no criteria for inpatient treatment or IVC at this time.  She is willing to consider the option and I provided outpatient resources.  I gave my usual and customary follow-up recommendations and return precautions.       FINAL CLINICAL IMPRESSION(S) / ED DIAGNOSES   Final diagnoses:  Panic attack  Atypical chest pain     Rx / DC Orders   ED Discharge Orders     None        Note:  This document was prepared using Dragon voice recognition software and may include unintentional dictation errors.   Loleta Rose, MD 05/05/22 [phone]

## 2022-05-19 ENCOUNTER — Other Ambulatory Visit (HOSPITAL_COMMUNITY): Payer: Self-pay | Admitting: Neurology

## 2022-05-19 ENCOUNTER — Other Ambulatory Visit: Payer: Self-pay | Admitting: Neurology

## 2022-05-19 DIAGNOSIS — I729 Aneurysm of unspecified site: Secondary | ICD-10-CM

## 2022-05-25 ENCOUNTER — Emergency Department
Admission: EM | Admit: 2022-05-25 | Discharge: 2022-05-26 | Disposition: A | Discharge status: Home / Self Care | Payer: Medicaid Other | Attending: Emergency Medicine | Admitting: Emergency Medicine

## 2022-05-25 ENCOUNTER — Other Ambulatory Visit: Payer: Self-pay

## 2022-05-25 ENCOUNTER — Emergency Department: Payer: Medicaid Other

## 2022-05-25 DIAGNOSIS — R002 Palpitations: Secondary | ICD-10-CM | POA: Diagnosis not present

## 2022-05-25 DIAGNOSIS — M79602 Pain in left arm: Secondary | ICD-10-CM | POA: Insufficient documentation

## 2022-05-25 DIAGNOSIS — H538 Other visual disturbances: Secondary | ICD-10-CM | POA: Insufficient documentation

## 2022-05-25 DIAGNOSIS — R42 Dizziness and giddiness: Secondary | ICD-10-CM | POA: Diagnosis not present

## 2022-05-25 DIAGNOSIS — R222 Localized swelling, mass and lump, trunk: Secondary | ICD-10-CM | POA: Diagnosis not present

## 2022-05-25 DIAGNOSIS — R079 Chest pain, unspecified: Secondary | ICD-10-CM | POA: Diagnosis present

## 2022-05-25 LAB — CBC
HCT: 46.1 % (ref 39.0–52.0)
Hemoglobin: 15.9 g/dL (ref 13.0–17.0)
MCH: 29.2 pg (ref 26.0–34.0)
MCHC: 34.5 g/dL (ref 30.0–36.0)
MCV: 84.6 fL (ref 80.0–100.0)
Platelets: 314 10*3/uL (ref 150–400)
RBC: 5.45 MIL/uL (ref 4.22–5.81)
RDW: 13.3 % (ref 11.5–15.5)
WBC: 8.5 10*3/uL (ref 4.0–10.5)
nRBC: 0 % (ref 0.0–0.2)

## 2022-05-25 LAB — BASIC METABOLIC PANEL
Anion gap: 6 (ref 5–15)
BUN: 9 mg/dL (ref 6–20)
CO2: 27 mmol/L (ref 22–32)
Calcium: 10 mg/dL (ref 8.9–10.3)
Chloride: 107 mmol/L (ref 98–111)
Creatinine, Ser: 0.67 mg/dL (ref 0.61–1.24)
GFR, Estimated: >60 mL/min (ref >60)
Glucose, Bld: 99 mg/dL (ref 70–99)
Potassium: 3.8 mmol/L (ref 3.5–5.1)
Sodium: 140 mmol/L (ref 135–145)

## 2022-05-25 LAB — TROPONIN I (HIGH SENSITIVITY): Troponin I (High Sensitivity): <2 ng/L (ref <18)

## 2022-05-25 NOTE — ED Triage Notes (Signed)
Pt states that they have been having loss of appetite, CP in the left going down the left arm, blurred vision, difficulty with words sometimes, and a lump in the L breast- pt did take 2 81mg  asa about 5 hours ago

## 2022-05-25 NOTE — ED Provider Notes (Signed)
[facility] Provider Note    Event Date/Time   First MD Initiated Contact with Patient 05/25/22 2216     (approximate)   History   Chest Pain   HPI  Gerald Olsen is a 26 y.o. adult who has had some chest pain radiating to the left arm.  It felt tight.  Patient also has a fluttering sensation in the chest which is worrisome for palpitations.  There is a lump about a half a centimeter in diameter in the left upper chest where the tail of the breast would be.  Reportedly there was a large bruise there before this was felt.  There is some question of the lump being attached to the muscle when I palpated.  There is also intermittent trouble with blurry vision and some dizziness.  Patient has been seeing neurology and is being worked up for many of the symptoms except the course of the chest pain.      Physical Exam   Triage Vital Signs: ED Triage Vitals  Enc Vitals Group     BP 05/25/22 1810 (!) 157/83     Pulse Rate 05/25/22 1810 98     Resp 05/25/22 1810 18     Temp 05/25/22 1810 98.7 F (37.1 C)     Temp Source 05/25/22 1810 Oral     SpO2 05/25/22 1810 97 %     Weight 05/25/22 1811 150 lb (68 kg)     Height 05/25/22 1811 5\' 8"  (1.727 m)     Olsen Circumference --      Peak Flow --      Pain Score 05/25/22 1810 8     Pain Loc --      Pain Edu? --      Excl. in GC? --     Most recent vital signs: Vitals:   05/25/22 2247 05/26/22 0018  BP: 128/81 129/77  Pulse: 87 81  Resp: 18 18  Temp: 98.6 F (37 C)   SpO2: 100% 94%     General: Awake, no distress.  CV:  Good peripheral perfusion.  Heart regular rate and rhythm no audible murmurs Resp:  Normal effort.  Lungs are clear Chest: Lump felt as noted in HPI Abd:  No distention.  Soft and nontender    ED Results / Procedures / Treatments   Labs (all labs ordered are listed, but only abnormal results are displayed) Labs Reviewed  BASIC METABOLIC PANEL  CBC  TROPONIN I (HIGH  SENSITIVITY)  TROPONIN I (HIGH SENSITIVITY)     EKG  EKG read and interpreted by me shows normal sinus rhythm rate of 76 normal axis no acute ST-T changes and is very similar to previous prior EKGs.   RADIOLOGY  Chest x-ray read by radiology reviewed and interpreted by me shows no acute disease I do not see the lump in the chest wall that I described above CT of the Olsen read by radiology reviewed and interpreted by me shows no acute disease PROCEDURES:  Critical Care performed:   Procedures   MEDICATIONS ORDERED IN ED: Medications - No data to display   IMPRESSION / MDM / ASSESSMENT AND PLAN / ED COURSE  I reviewed the triage vital signs and the nursing notes. I anticipate that if the ultrasound of the lump looks okay we can let the patient go follow-up with surgery for the lump and continue following up with neurology for the numbness tingling and blurry vision.  Patient has had several  visits for palpitations etc. which were all negative.  I will have him follow-up with his regular doctor who can refer him to cardiology if need be.   Patient's presentation is most consistent with acute illness / injury with system symptoms.  The patient is on the cardiac monitor to evaluate for evidence of arrhythmia and/or significant heart rate changes.  None have been seen      FINAL CLINICAL IMPRESSION(S) / ED DIAGNOSES   Final diagnoses:  Nonspecific chest pain  Palpitations  Dizziness and giddiness  Also a small lump in the left chest   Rx / DC Orders   ED Discharge Orders     None        Note:  This document was prepared using Dragon voice recognition software and may include unintentional dictation errors.   Arnaldo Natal, MD 05/26/22 [phone]

## 2022-05-25 NOTE — Discharge Instructions (Addendum)
Please follow-up with your primary care doc to continue checking on you.  They can help continue to evaluate the chest pain and palpitations.  It may be useful to have a ambulatory cardiac monitor placed at some point please continue follow-up with neurology for the numbness and blurry vision etc.  Dr. Everlene Farrier the surgeon can keep an eye on the lump in the chest.

## 2022-06-06 ENCOUNTER — Other Ambulatory Visit: Payer: Self-pay | Admitting: Neurology

## 2022-06-06 DIAGNOSIS — I729 Aneurysm of unspecified site: Secondary | ICD-10-CM

## 2022-06-10 ENCOUNTER — Ambulatory Visit
Admission: RE | Admit: 2022-06-10 | Discharge: 2022-06-10 | Disposition: A | Discharge status: Home / Self Care | Payer: Medicaid Other | Source: Ambulatory Visit | Attending: Neurology | Admitting: Neurology

## 2022-06-10 DIAGNOSIS — I729 Aneurysm of unspecified site: Secondary | ICD-10-CM | POA: Insufficient documentation

## 2022-06-12 ENCOUNTER — Ambulatory Visit: Payer: Medicaid Other | Admitting: Surgery

## 2022-06-14 ENCOUNTER — Encounter: Payer: Self-pay | Admitting: Emergency Medicine

## 2022-06-14 ENCOUNTER — Emergency Department
Admission: EM | Admit: 2022-06-14 | Discharge: 2022-06-14 | Disposition: A | Discharge status: Home / Self Care | Payer: Medicaid Other | Attending: Emergency Medicine | Admitting: Emergency Medicine

## 2022-06-14 DIAGNOSIS — R002 Palpitations: Secondary | ICD-10-CM

## 2022-06-14 DIAGNOSIS — F419 Anxiety disorder, unspecified: Secondary | ICD-10-CM | POA: Diagnosis not present

## 2022-06-14 DIAGNOSIS — R519 Headache, unspecified: Secondary | ICD-10-CM | POA: Diagnosis not present

## 2022-06-14 NOTE — ED Triage Notes (Signed)
Pt arrived via [facility] from home with c/o right corner of mouth numbness for aprox 20-30 seconds this AM. Pt sts its drooping. No facial droop noted, speech clear and face symmetrical.

## 2022-06-14 NOTE — ED Provider Notes (Signed)
[facility] Provider Note    Event Date/Time   First MD Initiated Contact with Patient 06/14/22 [phone]     (approximate)   History   Facial Pain   HPI  Gerald Olsen is a 26 y.o. adult who presents to the ED for evaluation of Facial Pain   I reviewed 9/12 neurology visit for patient was reporting chronic ongoing anxiety, facial numbness, headache, and coordination.  Outpatient MRA neck and head performed 4 days ago did not review these results with mild area of narrowing and beading to the V4 segment of right vertebral artery, otherwise no vascular pathology or signs of stroke.  Patient presents to the ED tonight for evaluation of twitching to the right side of her face and concerned that her smile was asymmetric.  She reports 6-12 months of similar episodes happening frequently and concerned that it might "just be anxiety."  No SI, HI  Also reports intermittent palpitations as recently as yesterday.  No chest pain.  Physical Exam   Triage Vital Signs: ED Triage Vitals [06/14/22 0304]  Enc Vitals Group     BP 134/78     Pulse Rate 97     Resp 18     Temp 98.9 F (37.2 C)     Temp Source Oral     SpO2 97 %     Weight      Height      Head Circumference      Peak Flow      Pain Score      Pain Loc      Pain Edu?      Excl. in [place]?     Most recent vital signs: Vitals:   06/14/22 0304  BP: 134/78  Pulse: 97  Resp: 18  Temp: 98.9 F (37.2 C)  SpO2: 97%    General: Awake, no distress.  CV:  Good peripheral perfusion.  Resp:  Normal effort.  Abd:  No distention.  MSK:  No deformity noted.  Neuro:  No focal deficits appreciated. Cranial nerves II through XII intact 5/5 strength and sensation in all 4 extremities Other:  Anxious, frequently picking at Band-Aids over the fingernails   ED Results / Procedures / Treatments   Labs (all labs ordered are listed, but only abnormal results are displayed) Labs Reviewed - No data to  display  EKG   RADIOLOGY   Official radiology report(s): No results found.  PROCEDURES and INTERVENTIONS:  Procedures  Medications - No data to display   IMPRESSION / MDM / [place] / ED COURSE  I reviewed the triage vital signs and the nursing notes.  Differential diagnosis includes, but is not limited to, anxiety, malingering, stroke, TIA  {Patient presents with symptoms of an acute illness or injury that is potentially life-threatening.  26 year old patient presents to the ED with 6 months of intermittent symptoms possibly due to anxiety and ultimately suitable for outpatient management.  Looks systemically well, neurologically intact without signs of trauma or any acute deficits.  Considering the recent MRI that was just done as an outpatient a few days ago, I see no indications for repeat CNS imaging.  EKG is reassuring considering recent palpitations.  We discussed return precautions and my recommendations for psychiatric follow-up.      FINAL CLINICAL IMPRESSION(S) / ED DIAGNOSES   Final diagnoses:  Palpitations  Anxiety  Facial discomfort     Rx / DC Orders   ED Discharge Orders  None        Note:  This document was prepared using Dragon voice recognition software and may include unintentional dictation errors.   Vladimir Crofts, MD 06/14/22 [phone]

## 2022-06-19 ENCOUNTER — Other Ambulatory Visit: Payer: Self-pay

## 2022-06-20 ENCOUNTER — Ambulatory Visit: Payer: Medicaid Other | Admitting: Gastroenterology

## 2022-06-20 ENCOUNTER — Encounter: Payer: Self-pay | Admitting: Gastroenterology

## 2022-06-21 ENCOUNTER — Other Ambulatory Visit: Payer: Self-pay

## 2022-06-21 ENCOUNTER — Emergency Department: Payer: Medicaid Other

## 2022-06-21 DIAGNOSIS — F419 Anxiety disorder, unspecified: Secondary | ICD-10-CM | POA: Insufficient documentation

## 2022-06-21 DIAGNOSIS — R202 Paresthesia of skin: Secondary | ICD-10-CM | POA: Insufficient documentation

## 2022-06-21 DIAGNOSIS — F411 Generalized anxiety disorder: Secondary | ICD-10-CM | POA: Diagnosis not present

## 2022-06-21 DIAGNOSIS — R079 Chest pain, unspecified: Secondary | ICD-10-CM | POA: Diagnosis present

## 2022-06-21 LAB — BASIC METABOLIC PANEL
Anion gap: 8 (ref 5–15)
BUN: 8 mg/dL (ref 6–20)
CO2: 24 mmol/L (ref 22–32)
Calcium: 9.3 mg/dL (ref 8.9–10.3)
Chloride: 108 mmol/L (ref 98–111)
Creatinine, Ser: 0.63 mg/dL (ref 0.61–1.24)
GFR, Estimated: >60 mL/min (ref >60)
Glucose, Bld: 101 mg/dL — ABNORMAL HIGH (ref 70–99)
Potassium: 3.6 mmol/L (ref 3.5–5.1)
Sodium: 140 mmol/L (ref 135–145)

## 2022-06-21 LAB — CBC
HCT: 44.4 % (ref 39.0–52.0)
Hemoglobin: 15.1 g/dL (ref 13.0–17.0)
MCH: 28.9 pg (ref 26.0–34.0)
MCHC: 34 g/dL (ref 30.0–36.0)
MCV: 85.1 fL (ref 80.0–100.0)
Platelets: 299 10*3/uL (ref 150–400)
RBC: 5.22 MIL/uL (ref 4.22–5.81)
RDW: 13.1 % (ref 11.5–15.5)
WBC: 7 10*3/uL (ref 4.0–10.5)
nRBC: 0 % (ref 0.0–0.2)

## 2022-06-21 LAB — TROPONIN I (HIGH SENSITIVITY): Troponin I (High Sensitivity): <2 ng/L (ref <18)

## 2022-06-21 NOTE — ED Triage Notes (Addendum)
Pt complaining of chest pressure. Pt felt HR going up and felt like they were going to faint, started 30 mins ago. Pt states having pain from neck to chest. Pt states "tongue feels a little numb"  Pt took 50mg  hydroxyzine, 3 baby aspirins throughout the day

## 2022-06-22 ENCOUNTER — Emergency Department (EMERGENCY_DEPARTMENT_HOSPITAL)
Admission: EM | Admit: 2022-06-22 | Discharge: 2022-06-22 | Disposition: A | Discharge status: Home / Self Care | Payer: Medicaid Other | Source: Home / Self Care | Attending: Emergency Medicine | Admitting: Emergency Medicine

## 2022-06-22 ENCOUNTER — Emergency Department
Admission: EM | Admit: 2022-06-22 | Discharge: 2022-06-22 | Disposition: A | Discharge status: Home / Self Care | Payer: Medicaid Other | Attending: Emergency Medicine | Admitting: Emergency Medicine

## 2022-06-22 DIAGNOSIS — R202 Paresthesia of skin: Secondary | ICD-10-CM

## 2022-06-22 DIAGNOSIS — F411 Generalized anxiety disorder: Secondary | ICD-10-CM | POA: Diagnosis not present

## 2022-06-22 DIAGNOSIS — F419 Anxiety disorder, unspecified: Secondary | ICD-10-CM

## 2022-06-22 DIAGNOSIS — F32A Depression, unspecified: Secondary | ICD-10-CM | POA: Diagnosis present

## 2022-06-22 DIAGNOSIS — R079 Chest pain, unspecified: Secondary | ICD-10-CM

## 2022-06-22 LAB — URINALYSIS, ROUTINE W REFLEX MICROSCOPIC
Bilirubin Urine: NEGATIVE
Glucose, UA: NEGATIVE mg/dL
Hgb urine dipstick: NEGATIVE
Ketones, ur: NEGATIVE mg/dL
Leukocytes,Ua: NEGATIVE
Nitrite: NEGATIVE
Protein, ur: NEGATIVE mg/dL
Specific Gravity, Urine: 1.017 (ref 1.005–1.030)
pH: 5 (ref 5.0–8.0)

## 2022-06-22 LAB — URINE DRUG SCREEN, QUALITATIVE (ARMC ONLY)
Amphetamines, Ur Screen: NOT DETECTED
Barbiturates, Ur Screen: NOT DETECTED
Benzodiazepine, Ur Scrn: NOT DETECTED
Cannabinoid 50 Ng, Ur ~~LOC~~: NOT DETECTED
Cocaine Metabolite,Ur ~~LOC~~: NOT DETECTED
MDMA (Ecstasy)Ur Screen: NOT DETECTED
Methadone Scn, Ur: NOT DETECTED
Opiate, Ur Screen: NOT DETECTED
Phencyclidine (PCP) Ur S: NOT DETECTED
Tricyclic, Ur Screen: NOT DETECTED

## 2022-06-22 LAB — SALICYLATE LEVEL: Salicylate Lvl: <7 mg/dL — ABNORMAL LOW (ref 7.0–30.0)

## 2022-06-22 LAB — COMPREHENSIVE METABOLIC PANEL
ALT: 38 U/L (ref 0–44)
AST: 26 U/L (ref 15–41)
Albumin: 4.6 g/dL (ref 3.5–5.0)
Alkaline Phosphatase: 68 U/L (ref 38–126)
Anion gap: 6 (ref 5–15)
BUN: 10 mg/dL (ref 6–20)
CO2: 26 mmol/L (ref 22–32)
Calcium: 9.8 mg/dL (ref 8.9–10.3)
Chloride: 109 mmol/L (ref 98–111)
Creatinine, Ser: 0.6 mg/dL — ABNORMAL LOW (ref 0.61–1.24)
GFR, Estimated: >60 mL/min (ref >60)
Glucose, Bld: 91 mg/dL (ref 70–99)
Potassium: 4.2 mmol/L (ref 3.5–5.1)
Sodium: 141 mmol/L (ref 135–145)
Total Bilirubin: 1.4 mg/dL — ABNORMAL HIGH (ref 0.3–1.2)
Total Protein: 7.3 g/dL (ref 6.5–8.1)

## 2022-06-22 LAB — ACETAMINOPHEN LEVEL: Acetaminophen (Tylenol), Serum: <10 ug/mL — ABNORMAL LOW (ref 10–30)

## 2022-06-22 LAB — TROPONIN I (HIGH SENSITIVITY): Troponin I (High Sensitivity): <2 ng/L (ref <18)

## 2022-06-22 LAB — TSH: TSH: 1.94 u[IU]/mL (ref 0.350–4.500)

## 2022-06-22 NOTE — ED Provider Triage Note (Signed)
Emergency Medicine Provider Triage Evaluation Note  Gerald Olsen , a 26 y.o. adult  was evaluated in triage.  Pt complains of whole body numbness.  No chest pain, chest tightness or abdominal pain.  No weakness in the upper and lower extremities.  Patient was seen and evaluated here yesterday for chest pain with reassuring labs.  Review of Systems  Positive: Patient has numbness.  Negative: No chest pain or abdominal pain.   Physical Exam  BP 135/79 (BP Location: Right Arm)   Pulse 60   Temp 98.4 F (36.9 C) (Oral)   Resp 17   Ht 5\' 8"  (1.727 m)   Wt 85 kg   SpO2 98%   BMI 28.49 kg/m  Gen:   Awake, no distress   Resp:  Normal effort  MSK:   Moves extremities without difficulty  Other:    Medical Decision Making  Medically screening exam initiated at 5:49 PM.  Appropriate orders placed.  Gerald Olsen was informed that the remainder of the evaluation will be completed by another provider, this initial triage assessment does not replace that evaluation, and the importance of remaining in the ED until their evaluation is complete.     Gerald Olsen, [place] 06/22/22 1750

## 2022-06-22 NOTE — ED Provider Notes (Signed)
[facility] Provider Note    Event Date/Time   First MD Initiated Contact with Patient 06/22/22 1828     (approximate)   History   Chief Complaint: Numbness (Face tounge, bilateral arms and chest , x 3 days , started 3 days prior , pt states he was seen yesterday here for same complaint )   HPI  Gerald Olsen is a 26 y.o. adult with a past history of chronic chest pain, anxiety, depression who comes the ED complaining of generalized altered sensation feeling described as paresthesia/diminished sensation which has been waxing and waning for the past 3 days, worse since yesterday.  Patient was seen here in the emergency department yesterday with reassuring work-up and discharged home.  Patient reports that they are not taking any medications currently except for baby aspirin and hydroxyzine.  Taking up to 4 baby aspirin per day.  No drug use or alcohol use, no tobacco or vaping.  Took 50 mg of hydroxyzine prior to coming to the ED and now feels drowsy.      Physical Exam   Triage Vital Signs: ED Triage Vitals  Enc Vitals Group     BP 06/22/22 1748 135/79     Pulse Rate 06/22/22 1748 60     Resp 06/22/22 1748 17     Temp 06/22/22 1748 98.4 F (36.9 C)     Temp Source 06/22/22 1748 Oral     SpO2 06/22/22 1748 98 %     Weight 06/22/22 1749 187 lb 6.3 oz (85 kg)     Height 06/22/22 1749 5\' 8"  (1.727 m)     Head Circumference --      Peak Flow --      Pain Score 06/22/22 1749 4     Pain Loc --      Pain Edu? --      Excl. in [place]? --     Most recent vital signs: Vitals:   06/22/22 1748  BP: 135/79  Pulse: 60  Resp: 17  Temp: 98.4 F (36.9 C)  SpO2: 98%    General: Awake, no distress.  CV:  Good peripheral perfusion.  Regular rate and rhythm Resp:  Normal effort.  Clear to auscultation bilaterally Abd:  No distention.  Other:  No lymphadenopathy or thyromegaly.  Cranial nerves II through XII intact, PERRL, EOMI   ED Results / Procedures  / Treatments   Labs (all labs ordered are listed, but only abnormal results are displayed) Labs Reviewed  SALICYLATE LEVEL  ACETAMINOPHEN LEVEL  COMPREHENSIVE METABOLIC PANEL  TSH  URINE DRUG SCREEN, QUALITATIVE ([facility] ONLY)  URINALYSIS, ROUTINE W REFLEX MICROSCOPIC     EKG Interpreted by me Normal sinus rhythm rate of 80.  Normal axis intervals QRS ST segments and T waves.   RADIOLOGY MR angiogram head and neck from June 10, 2022 reviewed, no acute findings, overall normal   PROCEDURES:  Procedures   MEDICATIONS ORDERED IN ED: Medications - No data to display   IMPRESSION / MDM / [place] / ED COURSE  I reviewed the triage vital signs and the nursing notes.                              Differential diagnosis includes, but is not limited to, anxiety, bipolar disorder, hypothyroidism, factitious disorder, somatization  Patient's presentation is most consistent with exacerbation of chronic illness.  Patient presents with recurrent generalized paresthesias symptoms  which are a chronic issue.  No SI HI or frank hallucinations but patient does endorse feeling more worried about their mental health and usual and feels like current symptoms are out of range of typical experience.  We will request psychiatry evaluation.  We will check labs today including salicylate level and TSH despite reassuring results yesterday.   Considering the patient's symptoms, medical history, and physical examination today, I have low suspicion for ACS, PE, TAD, pneumothorax, carditis, mediastinitis, pneumonia, CHF, or sepsis.        FINAL CLINICAL IMPRESSION(S) / ED DIAGNOSES   Final diagnoses:  Paresthesia     Rx / DC Orders   ED Discharge Orders     None        Note:  This document was prepared using Dragon voice recognition software and may include unintentional dictation errors.   Sharman Cheek, MD 06/22/22 1930

## 2022-06-22 NOTE — Discharge Instructions (Signed)
   Thank you for choosing us for your health care today!  Please see your primary doctor this week for a follow up appointment.   If you do not have a primary doctor call the following clinics to establish care:  If you have insurance:  [facility] [phone] [address] [place] [address]   [facility]  [phone] [address] [place] [address]   If you do not have insurance:  [facility]  [phone] [address] [place] [address]  Sometimes, in the early stages of certain disease courses it is difficult to detect in the emergency department evaluation -- so, it is important that you continue to monitor your symptoms and call your doctor right away or return to the emergency department if you develop any new or worsening symptoms.  It was my pleasure to care for you today.   Ismar Yabut S. Zen Cedillos, MD  

## 2022-06-22 NOTE — ED Triage Notes (Signed)
Face tongue, bilateral arms and chest , x 3 days , started 3 days prior , pt states he was seen yesterday here for same complaint

## 2022-06-22 NOTE — Consult Note (Addendum)
[place] [facility]iat[facility] Consult   Reason for Consult: Numbness (Face tounge, bilateral arms and chest , x 3 days Referring Physician:  Dr. Joni Fears Patient Identification: Gerald Olsen MRN:  [id number] Principal Diagnosis: <principal problem not specified> Diagnosis:  Active Problems:   Chest pain   Depression   GAD (generalized anxiety disorder)   Total Time spent with patient: 1 hour  Subjective: "I went numb." Gerald Olsen is a 26 y.o. adult patient presented to [facility] ED via POV with complaints of numbness (Face tounge, bilateral arms and chest , x 3 days , started 3 days prior , pt states he was seen yesterday here for same complaint ).  Per the ED triage nurses note, Face tongue, bilateral arms and chest , x 3 days , started 3 days prior , pt states he was seen yesterday here for same complaint   This provider saw The patient face-to-face; the chart was reviewed and consulted with the Betha Loa, [place] on 06/22/2022 due to the patient's care. It was discussed with the provider that the patient does not meet the criteria to be admitted to the psychiatric inpatient unit.  On evaluation, the patient is alert and oriented x 4, anxious but cooperative, and mood-congruent with affect. The patient does not appear to be responding to internal or external stimuli. Neither is the patient presenting with any delusional thinking. The patient denies auditory or visual hallucinations. The patient denies any suicidal, homicidal, or self-harm ideations. The patient does admit to increased anxiety and panic attacks. The patient is not presenting with any psychotic or paranoid behaviors. During an encounter with the patient, she could answer questions appropriately. The psychiatric team discussed with the patient that she needs to connect with outpatient services to receive help for her anxiety. The resources given to her are to have her link with a therapist and a provider for medication  management. The patient was receptive to receiving the information and voiced that she would follow up.   HPI: Per Dr. Alm Bustard, Gerald Olsen is a 26 y.o. adult with a past history of chronic chest pain, anxiety, depression who comes the ED complaining of generalized altered sensation feeling described as paresthesia/diminished sensation which has been waxing and waning for the past 3 days, worse since yesterday.  Patient was seen here in the emergency department yesterday with reassuring work-up and discharged home.   Patient reports that they are not taking any medications currently except for baby aspirin and hydroxyzine.  Taking up to 4 baby aspirin per day.  No drug use or alcohol use, no tobacco or vaping.  Took 50 mg of hydroxyzine prior to coming to the ED and now feels drowsy.  Past Psychiatric History:  ADHD Anxiety Tourette's   Risk to Self:   Risk to Others:   Prior Inpatient Therapy:   Prior Outpatient Therapy:    Past Medical History:  Past Medical History:  Diagnosis Date   ADHD    Anxiety    Asthma    Chest pain    Tourette's     Past Surgical History:  Procedure Laterality Date   INGUINAL HERNIA REPAIR  Infant   As Infant   Family History:  Family History  Problem Relation Age of Onset   Diabetes Father    Heart disease Father    Hyperlipidemia Father    Hypertension Father    Family Psychiatric  History: History reviewed. No pertinent family psychiatric history Social History:  Social History  Substance and Sexual Activity  Alcohol Use Yes   Comment: "every couple weeks"     Social History   Substance and Sexual Activity  Drug Use Not Currently   Types: Marijuana    Social History   Socioeconomic History   Marital status: Single    Spouse name: Not on file   Number of children: Not on file   Years of education: Not on file   Highest education level: Not on file  Occupational History   Not on file  Tobacco Use   Smoking status:  Never   Smokeless tobacco: Never  Vaping Use   Vaping Use: Never used  Substance and Sexual Activity   Alcohol use: Yes    Comment: "every couple weeks"   Drug use: Not Currently    Types: Marijuana   Sexual activity: Not on file  Other Topics Concern   Not on file  Social History Narrative   Not on file   Social Determinants of Health   Financial Resource Strain: Not on file  Food Insecurity: Not on file  Transportation Needs: Not on file  Physical Activity: Not on file  Stress: Not on file  Social Connections: Not on file   Additional Social History:    Allergies:   Allergies  Allergen Reactions   Shrimp (Diagnostic)     Labs:  Results for orders placed or performed during the hospital encounter of 06/22/22 (from the past 48 hour(s))  Salicylate level     Status: Abnormal   Collection Time: 06/22/22  7:10 PM  Result Value Ref Range   Salicylate Lvl <7.0 (L) 7.0 - 30.0 mg/dL    Comment: Performed at [facility], [address], [place], [place] [address]  Acetaminophen level     Status: Abnormal   Collection Time: 06/22/22  7:10 PM  Result Value Ref Range   Acetaminophen (Tylenol), Serum <10 (L) 10 - 30 ug/mL    Comment: (NOTE) Therapeutic concentrations vary significantly. A range of 10-30 ug/mL  may be an effective concentration for many patients. However, some  are best treated at concentrations outside of this range. Acetaminophen concentrations >150 ug/mL at 4 hours after ingestion  and >50 ug/mL at 12 hours after ingestion are often associated with  toxic reactions.  Performed at [facility], [address] [place], [place] [address]   Comprehensive metabolic panel     Status: Abnormal   Collection Time: 06/22/22  7:10 PM  Result Value Ref Range   Sodium 141 135 - 145 mmol/L   Potassium 4.2 3.5 - 5.1 mmol/L   Chloride 109 98 - 111 mmol/L   CO2 26 22 - 32 mmol/L   Glucose, Bld 91 70 - 99 mg/dL    Comment: Glucose reference  range applies only to samples taken after fasting for at least 8 hours.   BUN 10 6 - 20 mg/dL   Creatinine, Ser 2.72 (L) 0.61 - 1.24 mg/dL   Calcium 9.8 8.9 - 53.6 mg/dL   Total Protein 7.3 6.5 - 8.1 g/dL   Albumin 4.6 3.5 - 5.0 g/dL   AST 26 15 - 41 U/L   ALT 38 0 - 44 U/L   Alkaline Phosphatase 68 38 - 126 U/L   Total Bilirubin 1.4 (H) 0.3 - 1.2 mg/dL   GFR, Estimated >64 >40 mL/min    Comment: (NOTE) Calculated using the CKD-EPI Creatinine Equation (2021)    Anion gap 6 5 - 15    Comment: Performed  at [facility] [place] [facility], [address] [place], [place] [address]  TSH     Status: None   Collection Time: 06/22/22  7:10 PM  Result Value Ref Range   TSH 1.940 0.350 - 4.500 uIU/mL    Comment: Performed by a 3rd Generation assay with a functional sensitivity of <=0.01 uIU/mL. Performed at [facility], [address] [place], [place] [address]   Urine Drug Screen, Qualitative     Status: None   Collection Time: 06/22/22  7:10 PM  Result Value Ref Range   Tricyclic, Ur Screen NONE DETECTED NONE DETECTED   Amphetamines, Ur Screen NONE DETECTED NONE DETECTED   MDMA (Ecstasy)Ur Screen NONE DETECTED NONE DETECTED   Cocaine Metabolite,Ur [place] NONE DETECTED NONE DETECTED   Opiate, Ur Screen NONE DETECTED NONE DETECTED   Phencyclidine (PCP) Ur S NONE DETECTED NONE DETECTED   Cannabinoid 50 Ng, Ur [place] NONE DETECTED NONE DETECTED   Barbiturates, Ur Screen NONE DETECTED NONE DETECTED   Benzodiazepine, Ur Scrn NONE DETECTED NONE DETECTED   Methadone Scn, Ur NONE DETECTED NONE DETECTED    Comment: (NOTE) Tricyclics + metabolites, urine    Cutoff 1000 ng/mL Amphetamines + metabolites, urine  Cutoff 1000 ng/mL MDMA (Ecstasy), urine              Cutoff 500 ng/mL Cocaine Metabolite, urine          Cutoff 300 ng/mL Opiate + metabolites, urine        Cutoff 300 ng/mL Phencyclidine (PCP), urine         Cutoff 25 ng/mL Cannabinoid, urine                 Cutoff 50 ng/mL Barbiturates +  metabolites, urine  Cutoff 200 ng/mL Benzodiazepine, urine              Cutoff 200 ng/mL Methadone, urine                   Cutoff 300 ng/mL  The urine drug screen provides only a preliminary, unconfirmed analytical test result and should not be used for non-medical purposes. Clinical consideration and professional judgment should be applied to any positive drug screen result due to possible interfering substances. A more specific alternate chemical method must be used in order to obtain a confirmed analytical result. Gas chromatography / mass spectrometry (GC/MS) is the preferred confirm atory method. Performed at [facility], [address] [place], [place] [address]   Urinalysis, Routine w reflex microscopic Urine, Clean Catch     Status: Abnormal   Collection Time: 06/22/22  7:10 PM  Result Value Ref Range   Color, Urine YELLOW (A) YELLOW   APPearance CLEAR (A) CLEAR   Specific Gravity, Urine 1.017 1.005 - 1.030   pH 5.0 5.0 - 8.0   Glucose, UA NEGATIVE NEGATIVE mg/dL   Hgb urine dipstick NEGATIVE NEGATIVE   Bilirubin Urine NEGATIVE NEGATIVE   Ketones, ur NEGATIVE NEGATIVE mg/dL   Protein, ur NEGATIVE NEGATIVE mg/dL   Nitrite NEGATIVE NEGATIVE   Leukocytes,Ua NEGATIVE NEGATIVE    Comment: Performed at [facility], [address] [place], [place] [address]    No current facility-administered medications for this encounter.   Current Outpatient Medications  Medication Sig Dispense Refill   albuterol (VENTOLIN HFA) 108 (90 Base) MCG/ACT inhaler Inhale 2 puffs into the lungs every 6 (six) hours as needed for wheezing or shortness of breath. 8 g 0   ARIPiprazole (ABILIFY) 2 MG tablet  Take by mouth.     aspirin EC 81 MG tablet Take by mouth.     busPIRone (BUSPAR) 10 MG tablet Take 10 mg by mouth 2 (two) times daily.     clonazePAM (KLONOPIN) 0.5 MG tablet Take by mouth.     fluvoxaMINE (LUVOX) 100 MG tablet Take 100 mg by mouth 2 (two) times daily.      hydrOXYzine (ATARAX) 50 MG tablet Take by mouth.     LORazepam (ATIVAN) 0.5 MG tablet Take 0.5 mg by mouth daily as needed.     pantoprazole (PROTONIX) 20 MG tablet Take by mouth.     phenol (CHLORASEPTIC MOUTH PAIN) 1.4 % LIQD Use as directed 1 spray in the mouth or throat as needed for throat irritation / pain. 20 mL 0   QUEtiapine (SEROQUEL) 50 MG tablet Take 50 mg by mouth at bedtime.     topiramate (TOPAMAX) 50 MG tablet Take 50 mg by mouth at bedtime.      Musculoskeletal: Strength & Muscle Tone: within normal limits Gait & Station: normal Patient leans: N/A  Psychiatric Specialty Exam:  Presentation  General Appearance: Appropriate for Environment  Eye Contact:Good  Speech:Clear and Coherent  Speech Volume:Normal  Handedness:Right   Mood and Affect  Mood:Anxious  Affect:Appropriate   Thought Process  Thought Processes:Coherent  Descriptions of Associations:Intact  Orientation:Full (Time, Place and Person)  Thought Content:Logical  History of Schizophrenia/Schizoaffective disorder:No  Duration of Psychotic Symptoms:No data recorded Hallucinations:Hallucinations: None  Ideas of Reference:None  Suicidal Thoughts:Suicidal Thoughts: No  Homicidal Thoughts:Homicidal Thoughts: No   Sensorium  Memory:Immediate Good; Recent Good; Remote Good  Judgment:Intact  Insight:Good   Executive Functions  Concentration:Good  Attention Span:Good  Recall:Good  Fund of Knowledge:Good  Language:Good   Psychomotor Activity  Psychomotor Activity:Psychomotor Activity: Normal   Assets  Assets:Communication Skills; Desire for Improvement; Resilience; Social Support   Sleep  Sleep:Sleep: Fair   Physical Exam: Physical Exam Vitals and nursing note reviewed.  Constitutional:      Appearance: Normal appearance.  HENT:     Head: Normocephalic and atraumatic.     Right Ear: External ear normal.     Left Ear: External ear normal.     Nose: Nose  normal.  Cardiovascular:     Rate and Rhythm: Normal rate.     Pulses: Normal pulses.  Pulmonary:     Effort: Pulmonary effort is normal.  Musculoskeletal:        General: Normal range of motion.     Cervical back: Normal range of motion and neck supple.  Neurological:     General: No focal deficit present.     Mental Status: She is alert and oriented to person, place, and time.  Psychiatric:        Attention and Perception: Attention and perception normal.        Mood and Affect: Mood is anxious. Affect is blunt.        Speech: Speech normal.        Behavior: Behavior normal. Behavior is cooperative.        Thought Content: Thought content normal.        Cognition and Memory: Cognition and memory normal.        Judgment: Judgment normal.    Review of Systems  Psychiatric/Behavioral:  The patient is nervous/anxious.   All other systems reviewed and are negative.  Blood pressure 135/79, pulse 60, temperature 98.4 F (36.9 C), temperature source Oral, resp. rate 17, height 5\' 8"  (1.727 m),  weight 85 kg, SpO2 98 %. Body mass index is 28.49 kg/m.  Treatment Plan Summary: Plan Patient does not meet criteria for psychiatric inpatient admission  Disposition: No evidence of imminent risk to self or others at present.   Patient does not meet criteria for psychiatric inpatient admission. Supportive therapy provided about ongoing stressors. Discussed crisis plan, support from social network, calling 911, coming to the Emergency Department, and calling Suicide Hotline.  Gillermo Murdoch, NP 06/22/2022 10:25 PM

## 2022-06-22 NOTE — ED Provider Notes (Signed)
-----------------------------------------   10:27 PM on 06/22/2022 -----------------------------------------  Blood pressure 135/79, pulse 60, temperature 98.4 F (36.9 C), temperature source Oral, resp. rate 17, height 5\' 8"  (1.727 m), weight 85 kg, SpO2 98 %.  Assuming care from Dr. Joni Fears.  In short, Gerald Olsen is a 26 y.o. adult with a chief complaint of Numbness (Face tounge, bilateral arms and chest , x 3 days , started 3 days prior , pt states he was seen yesterday here for same complaint ) .  Refer to the original H&P for additional details.  The current plan of care is to psych evaluation.  Patient presents to the ED with chronic chest pain, anxiety, depression.  Patient has been on multiple medications in the past for anxiety and depression but is currently only taking aspirin and hydroxyzine.  Patient came in today with generalized numbness.  Patient has a long history of anxiety.  She was evaluated by the previous provider, reassuring work-up but did feel like she needed to talk to psychiatry.  Psychiatry evaluated the patient and feels that she is stable for outpatient follow-up.  They provided community resources for the patient.  At this time I will discharge the patient at this time.   ED diagnosis:  Paresthesia Anxiety    Gerald Olsen 06/22/22 2253    Carrie Mew, MD 06/23/22 1102

## 2022-06-22 NOTE — ED Provider Notes (Signed)
[facility] Provider Note    Event Date/Time   First MD Initiated Contact with Patient 06/22/22 0045     (approximate)   History   No chief complaint on file.   HPI  Gerald Olsen is a 26 y.o. adult   Past medical history of anxiety, ADHD, chronic chest pain, Tourette's who presents with an episode of presents with chest discomfort described as pressure sensation nonexertional, nonradiating lasting about 30 minutes to 1 hour and resolving continuously.  Happened about dinnertime this evening.  No recent illnesses or injuries.  No complaints currently, symptoms resolved.  History was obtained via the patient and review of external medical notes including a visit several weeks ago in the emergency department for palpitations and chest discomfort with negative findings in the emergency department      Physical Exam   Triage Vital Signs: ED Triage Vitals  Enc Vitals Group     BP 06/21/22 2133 124/80     Pulse Rate 06/21/22 2133 (!) 107     Resp 06/21/22 2133 18     Temp 06/21/22 2133 98.7 F (37.1 C)     Temp Source 06/21/22 2134 Oral     SpO2 06/21/22 2133 94 %     Weight 06/21/22 2135 189 lb (85.7 kg)     Height 06/21/22 2135 5\' 8"  (1.727 m)     Head Circumference --      Peak Flow --      Pain Score 06/21/22 2134 7     Pain Loc --      Pain Edu? --      Excl. in [place]? --     Most recent vital signs: Vitals:   06/21/22 2134 06/21/22 2356  BP: 124/80 125/68  Pulse: (!) 107 81  Resp: 17 16  Temp: 98.7 F (37.1 C)   SpO2: 94% 97%    General: Awake, no distress.  CV:  Good peripheral perfusion. Normal rate and rhythm no murmur Resp:  Normal effort.  Abd:  No distention.  Other:  Oriented, cooperative and pleasant.  No acute distress.  Nontoxic-appearing.   ED Results / Procedures / Treatments   Labs (all labs ordered are listed, but only abnormal results are displayed) Labs Reviewed  BASIC METABOLIC PANEL - Abnormal; Notable for  the following components:      Result Value   Glucose, Bld 101 (*)    All other components within normal limits  CBC  TROPONIN I (HIGH SENSITIVITY)  TROPONIN I (HIGH SENSITIVITY)     I reviewed labs and they are notable for neg troponin x2  EKG  ED ECG REPORT I, 06/23/22, the attending physician, personally viewed and interpreted this ECG.   Date: 06/22/2022  EKG Time: 2138  Rate: 97  Rhythm: normal EKG, normal sinus rhythm  Axis: normal  Intervals:none  ST&T Change: Ischemic changes, no dysrhythmias noted    RADIOLOGY I independently reviewed and interpreted CXR and see no obvious focalities or pneumothorax   PROCEDURES:  Critical Care performed: No  Procedures   MEDICATIONS ORDERED IN ED: Medications - No data to display  IMPRESSION / MDM / ASSESSMENT AND PLAN / ED COURSE  I reviewed the triage vital signs and the nursing notes.                              Differential diagnosis includes, but is not limited to, ACS, dysrhythmia, respiratory  infection, anxiety related -- considered but less likely PE, dissection given transient nature and normal hemodynamics & low risk factors     MDM: With symptoms completely resolved at this time, troponins flat x2, and negative work-up including chest x-ray and basic labs.  Plan for discharge home at this time with close PMD follow-up and return precautions for any worsening.   Patient's presentation is most consistent with acute presentation with potential threat to life or bodily function.       FINAL CLINICAL IMPRESSION(S) / ED DIAGNOSES   Final diagnoses:  Chest pain, unspecified type     Rx / DC Orders   ED Discharge Orders     None        Note:  This document was prepared using Dragon voice recognition software and may include unintentional dictation errors.    Lucillie Garfinkel, MD 06/22/22 [place]

## 2022-06-22 NOTE — BH Assessment (Signed)
This Probation officer provided pt's care team with outpatient psychiatric treatment resources for pt to follow up post discharge.

## 2022-06-26 ENCOUNTER — Encounter: Payer: Self-pay | Admitting: Emergency Medicine

## 2022-06-26 ENCOUNTER — Other Ambulatory Visit: Payer: Self-pay

## 2022-06-26 ENCOUNTER — Emergency Department
Admission: EM | Admit: 2022-06-26 | Discharge: 2022-06-26 | Disposition: A | Discharge status: Home / Self Care | Payer: Medicaid Other | Attending: Emergency Medicine | Admitting: Emergency Medicine

## 2022-06-26 DIAGNOSIS — R0602 Shortness of breath: Secondary | ICD-10-CM | POA: Diagnosis not present

## 2022-06-26 DIAGNOSIS — R200 Anesthesia of skin: Secondary | ICD-10-CM | POA: Insufficient documentation

## 2022-06-26 DIAGNOSIS — R42 Dizziness and giddiness: Secondary | ICD-10-CM

## 2022-06-26 LAB — BASIC METABOLIC PANEL
Anion gap: 6 (ref 5–15)
BUN: 14 mg/dL (ref 6–20)
CO2: 26 mmol/L (ref 22–32)
Calcium: 9.9 mg/dL (ref 8.9–10.3)
Chloride: 107 mmol/L (ref 98–111)
Creatinine, Ser: 0.57 mg/dL — ABNORMAL LOW (ref 0.61–1.24)
GFR, Estimated: >60 mL/min (ref >60)
Glucose, Bld: 107 mg/dL — ABNORMAL HIGH (ref 70–99)
Potassium: 4.1 mmol/L (ref 3.5–5.1)
Sodium: 139 mmol/L (ref 135–145)

## 2022-06-26 LAB — CBC
HCT: 48.8 % (ref 39.0–52.0)
Hemoglobin: 16.7 g/dL (ref 13.0–17.0)
MCH: 28.8 pg (ref 26.0–34.0)
MCHC: 34.2 g/dL (ref 30.0–36.0)
MCV: 84.1 fL (ref 80.0–100.0)
Platelets: 358 10*3/uL (ref 150–400)
RBC: 5.8 MIL/uL (ref 4.22–5.81)
RDW: 12.7 % (ref 11.5–15.5)
WBC: 6.8 10*3/uL (ref 4.0–10.5)
nRBC: 0 % (ref 0.0–0.2)

## 2022-06-26 LAB — URINALYSIS, ROUTINE W REFLEX MICROSCOPIC
Bilirubin Urine: NEGATIVE
Glucose, UA: NEGATIVE mg/dL
Hgb urine dipstick: NEGATIVE
Ketones, ur: NEGATIVE mg/dL
Leukocytes,Ua: NEGATIVE
Nitrite: NEGATIVE
Protein, ur: NEGATIVE mg/dL
Specific Gravity, Urine: 1.02 (ref 1.005–1.030)
pH: 7 (ref 5.0–8.0)

## 2022-06-26 NOTE — ED Provider Notes (Signed)
[facility] Provider Note    Event Date/Time   First MD Initiated Contact with Patient 06/26/22 1254     (approximate)   History   Dizziness   HPI  Gerald Olsen is a 26 y.o. adult   Past medical history of Gerald Olsen who has had many visits to the emergency department for ongoing transient dizziness, lightheadedness, shortness of breath, palpitations, numbness in the hands which is unchanged in quality and frequency but patient is back the patient has not received answers for her symptoms yet.  Work-up within the past several months for similar presentations include an MR angio of the head and neck, CT scan of the head, multiple blood testing, troponin, EKGs, infectious work-ups, which have been unrevealing.  Changes in the symptoms.  Patient has not been able to see primary doctor because the clinic has been closed but scheduled a new primary doctor appointment for oct 9th.  Has met with neurologist who recommended anxiety medication which the patient has not yet started.  Psychiatry has seen patient and recommended outpatient resources which the patient has not yet connected with.  History was obtained via patient and review of external medical notes including emergency department visits last month.      Physical Exam   Triage Vital Signs: ED Triage Vitals [06/26/22 1238]  Enc Vitals Group     BP 121/84     Pulse Rate 97     Resp 16     Temp 99 F (37.2 C)     Temp Source Oral     SpO2 97 %     Weight 180 lb (81.6 kg)     Height 5' 8" (1.727 m)     Head Circumference      Peak Flow      Pain Score 6     Pain Loc      Pain Edu?      Excl. in [place]?     Most recent vital signs: Vitals:   06/26/22 1238  BP: 121/84  Pulse: 97  Resp: 16  Temp: 99 F (37.2 C)  SpO2: 97%    General: Awake, no distress.  Nontoxic-appearing, alert and oriented.  Hemodynamics appropriate and reassuring no fever. CV:  Good peripheral perfusion.  Resp:  Normal  effort.  Clear  Abd:  No distention.  Nontender Other:  No focal deficits does not appear acutely intoxicated or in withdrawal   ED Results / Procedures / Treatments   Labs (all labs ordered are listed, but only abnormal results are displayed) Labs Reviewed  BASIC METABOLIC PANEL - Abnormal; Notable for the following components:      Result Value   Glucose, Bld 107 (*)    Creatinine, Ser 0.57 (*)    All other components within normal limits  URINALYSIS, ROUTINE W REFLEX MICROSCOPIC - Abnormal; Notable for the following components:   Color, Urine YELLOW (*)    APPearance CLOUDY (*)    All other components within normal limits  CBC     I reviewed labs and they are notable for no leukocytosis or anemia  EKG  ED ECG REPORT I, Lucillie Garfinkel, the attending physician, personally viewed and interpreted this ECG.   Date: 06/26/2022  EKG Time: 1240  Rate: 101  Rhythm: sinus tachycardia  Axis: normal  Intervals:none  ST&T Change: no ischemia or dysrthymia      PROCEDURES:  Critical Care performed: No  Procedures   MEDICATIONS ORDERED IN ED: Medications -  No data to display  IMPRESSION / MDM / [place] / ED COURSE  I reviewed the triage vital signs and the nursing notes.                              Differential diagnosis includes, but is not limited to, anxiety, ACS, infection    MDM: Patient with ongoing vague symptoms most consistent with panic attacks or anxiety and has had a significant amount of work-up including testing for ACS, multiple EKGs, neuroimaging which have been unrevealing.  Patient with unchanged symptoms today.  Labs unremarkable work-up today unremarkable and exam is largely unremarkable as well, not suggestive of any focal neurological deficits unlikely bleed, CVA, meningitis or encephalopathy.  Patient has been connected with neurology, psychiatry, primary care which the patient was advised to follow-up with.   Patient's  presentation is most consistent with severe exacerbation of chronic illness.       FINAL CLINICAL IMPRESSION(S) / ED DIAGNOSES   Final diagnoses:  Dizziness     Rx / DC Orders   ED Discharge Orders     None        Note:  This document was prepared using Dragon voice recognition software and may include unintentional dictation errors.    Lucillie Garfinkel, MD 06/26/22 1350

## 2022-06-26 NOTE — ED Notes (Signed)
First Nurse: Pt BIB EMS for near syncope. Per EMS, pt endorses CP as well.   132/88 108 98% R/A

## 2022-06-26 NOTE — ED Notes (Signed)
Pt A&O, pt given discharge instructions, pt ambulating with steady gait. 

## 2022-06-26 NOTE — Discharge Instructions (Signed)
   Thank you for choosing us for your health care today!  Please see your primary doctor this week for a follow up appointment.   If you do not have a primary doctor call the following clinics to establish care:  If you have insurance:  [facility] [phone] [address] [place] [address]   [facility]  [phone] [address] [place] [address]   If you do not have insurance:  [facility]  [phone] [address]  [place] [address]  Sometimes, in the early stages of certain disease courses it is difficult to detect in the emergency department evaluation -- so, it is important that you continue to monitor your symptoms and call your doctor right away or return to the emergency department if you develop any new or worsening symptoms.  It was my pleasure to care for you today.   Librada Castronovo S. Joella Saefong, MD  

## 2022-06-26 NOTE — ED Triage Notes (Signed)
Pt complains of lightheadedness, right hand numbness, right sided chest pain, heart palpitations all symptoms have been on and off all year and has been seen here multiple times for similar complains and states today feels worse. Has not had a meal in 3-5 days. States today has felt disoriented and confused.

## 2022-06-26 NOTE — ED Triage Notes (Signed)
Pt feels depersonalized for the past 6 months and feels like she is underwater. Also has a waxy feeling on tongue.

## 2022-07-06 ENCOUNTER — Other Ambulatory Visit: Payer: Self-pay

## 2022-07-06 ENCOUNTER — Encounter: Payer: Self-pay | Admitting: Emergency Medicine

## 2022-07-06 ENCOUNTER — Emergency Department
Admission: EM | Admit: 2022-07-06 | Discharge: 2022-07-06 | Disposition: A | Discharge status: Home / Self Care | Payer: Medicaid Other | Attending: Emergency Medicine | Admitting: Emergency Medicine

## 2022-07-06 DIAGNOSIS — F419 Anxiety disorder, unspecified: Secondary | ICD-10-CM | POA: Insufficient documentation

## 2022-07-06 DIAGNOSIS — R42 Dizziness and giddiness: Secondary | ICD-10-CM | POA: Diagnosis present

## 2022-07-06 MED ORDER — METOCLOPRAMIDE HCL 10 MG PO TABS: 10.0000 mg | ORAL_TABLET | Freq: Three times a day (TID) | ORAL | 1 refills | Status: AC

## 2022-07-06 MED ORDER — SERTRALINE HCL 25 MG PO TABS: 25.0000 mg | ORAL_TABLET | Freq: Every day | ORAL | 1 refills | Status: DC

## 2022-07-06 NOTE — ED Triage Notes (Signed)
Pt to ED from home c/o dizziness and posterior head pressure that started approx 69min PTA.  C/o pressure to head, tongue feeling weird, cold sensation to face and left eye blurriness intermittently but will go away after blinking several times.  Also states like foam in pt's throat with hx of acid reflux but did not OTC medications for heartburn.  Also states has only eaten [employer] chips and a couple protein shakes a day for 7 months.  Pt denies drugs or alcohol.  Pt A&Ox4, chest rise even and unlabored, unable to sit still, and in NAD at this time.

## 2022-07-06 NOTE — ED Provider Notes (Signed)
[facility] Provider Note    None    (approximate)   History   Dizziness and Anxiety   HPI  Gerald Olsen is a 26 y.o. adult who presents to the ED for evaluation of Dizziness and Anxiety   Patient is well-known to me presents to the ED after an episode of dizziness.  Reports sensation of food getting stuck in her throat with ingestion yesterday, so she has not been eating anything for the past 1 day.  Reports feeling dizziness and anxiety, since improved.  Has been able to drink water and keep liquids down, but was concerned might happen again.  Reports recently getting a prescription for SSRI sent but it did not go through the pharmacy because apparently the prescriber was from another state and is requesting I send this prescription.  Physical Exam   Triage Vital Signs: ED Triage Vitals  Enc Vitals Group     BP 07/06/22 0100 127/76     Pulse Rate 07/06/22 0100 (!) 106     Resp 07/06/22 0100 20     Temp 07/06/22 0100 98.3 F (36.8 C)     Temp Source 07/06/22 0100 Oral     SpO2 07/06/22 0100 96 %     Weight 07/06/22 0052 182 lb (82.6 kg)     Height 07/06/22 0052 5\' 8"  (1.727 m)     Head Circumference --      Peak Flow --      Pain Score 07/06/22 0052 7     Pain Loc --      Pain Edu? --      Excl. in [place]? --     Most recent vital signs: Vitals:   07/06/22 0100  BP: 127/76  Pulse: (!) 106  Resp: 20  Temp: 98.3 F (36.8 C)  SpO2: 96%    General: Awake, no distress.  Pleasant, somewhat anxious, but with linear thought processes. CV:  Good peripheral perfusion. RRR without appreciable murmur.  Resp:  Normal effort.  Abd:  No distention.  MSK:  No deformity noted.  Neuro:  No focal deficits appreciated. Cranial nerves II through XII intact 5/5 strength and sensation in all 4 extremities Other:     ED Results / Procedures / Treatments   Labs (all labs ordered are listed, but only abnormal results are displayed) Labs Reviewed -  No data to display  EKG   RADIOLOGY   Official radiology report(s): No results found.  PROCEDURES and INTERVENTIONS:  Procedures  Medications - No data to display   IMPRESSION / MDM / [place] / ED COURSE  I reviewed the triage vital signs and the nursing notes.  Differential diagnosis includes, but is not limited to, cardiac dysrhythmia, anxiety, dehydration, vasovagal episode  Anxious 26 year old presents with another episode of dizziness, possibly related to anxiety but ultimately suitable for outpatient management.  Looks systemically well to me with a reassuring examination.  Very extensive work-up in the past few months including MRI/MRA of the head and neck.  I do not see any indications for repeat diagnostics considering the typical nature of her symptoms and reassuring examination.  I provided reassurance, education.  Prescriptions for her SSRI and provided Reglan as needed.  We discussed appropriate return precautions.      FINAL CLINICAL IMPRESSION(S) / ED DIAGNOSES   Final diagnoses:  Dizziness     Rx / DC Orders   ED Discharge Orders  Ordered    metoCLOPramide (REGLAN) 10 MG tablet  3 times daily with meals        07/06/22 0107    sertraline (ZOLOFT) 25 MG tablet  Daily        07/06/22 0108             Note:  This document was prepared using Dragon voice recognition software and may include unintentional dictation errors.   [place]ton [place], MD 07/06/22 [phone]

## 2022-07-15 ENCOUNTER — Emergency Department
Admission: EM | Admit: 2022-07-15 | Discharge: 2022-07-15 | Disposition: A | Discharge status: Home / Self Care | Payer: Medicaid Other | Attending: Emergency Medicine | Admitting: Emergency Medicine

## 2022-07-15 ENCOUNTER — Encounter: Payer: Self-pay | Admitting: Emergency Medicine

## 2022-07-15 ENCOUNTER — Emergency Department: Payer: Medicaid Other

## 2022-07-15 DIAGNOSIS — R002 Palpitations: Secondary | ICD-10-CM | POA: Diagnosis present

## 2022-07-15 DIAGNOSIS — R200 Anesthesia of skin: Secondary | ICD-10-CM | POA: Insufficient documentation

## 2022-07-15 NOTE — ED Notes (Signed)
E-signature pad unavailable - Pt verbalized understanding of D/C information - no additional concerns at this time.  

## 2022-07-15 NOTE — ED Triage Notes (Signed)
Pt presents via POV with complaints of anxiety with palpitations. He notes taking one Atarax PTA with no improvement. Denies CP or SOB.

## 2022-07-15 NOTE — ED Provider Notes (Signed)
[facility] Provider Note    Event Date/Time   First MD Initiated Contact with Patient 07/15/22 [phone]     (approximate)   History   Anxiety   HPI  Gerald Olsen is a 26 y.o. adult who presents to the ED for evaluation of Anxiety   Patient is well-known to me presents to the ED with palpitations.  She reports concerned that she is having a stroke or heart attack.  She had an MRA neck and head performed a month ago for the same symptoms that was normal.  I seen her multiple times for the same complaints.  No syncope or novel features.  No chest pain.  Requesting a CT scan of her head and neck.   Physical Exam   Triage Vital Signs: ED Triage Vitals  Enc Vitals Group     BP      Pulse      Resp      Temp      Temp src      SpO2      Weight      Height      Head Circumference      Peak Flow      Pain Score      Pain Loc      Pain Edu?      Excl. in [place]?     Most recent vital signs: Vitals:   07/15/22 0232 07/15/22 0406  BP: 125/85 122/80  Pulse: (!) 111 (!) 102  Resp: 20 18  Temp: 98.3 F (36.8 C)   SpO2: 98% 98%    General: Awake, no distress.  Seems anxious, but looks systemically well. CV:  Good peripheral perfusion.  Tachycardic and regular. Resp:  Normal effort.  Abd:  No distention.  MSK:  No deformity noted.  Neuro:  No focal deficits appreciated. Cranial nerves II through XII intact 5/5 strength and sensation in all 4 extremities Other:     ED Results / Procedures / Treatments   Labs (all labs ordered are listed, but only abnormal results are displayed) Labs Reviewed - No data to display  EKG Sinus tachycardia the rate of 111 bpm.  Rightward axis.  Incomplete right bundle.  No STEMI.  Similar to previous EKGs, as recently as 10/2.  RADIOLOGY CT head interpreted by me without evidence of acute intracranial pathology CT cervical spine interpreted by me without evidence of acute fracture  Official radiology  report(s): CT Cervical Spine Wo Contrast  Result Date: 07/15/2022 CLINICAL DATA:  Neck pain.  Anxiety and palpitations. EXAM: CT CERVICAL SPINE WITHOUT CONTRAST TECHNIQUE: Multidetector CT imaging of the cervical spine was performed without intravenous contrast. Multiplanar CT image reconstructions were also generated. RADIATION DOSE REDUCTION: This exam was performed according to the departmental dose-optimization program which includes automated exposure control, adjustment of the mA and/or kV according to patient size and/or use of iterative reconstruction technique. COMPARISON:  Cervical spine radiographs 04/17/2022 FINDINGS: Alignment: Normal. Skull base and vertebrae: No acute fracture. No primary bone lesion or focal pathologic process. Soft tissues and spinal canal: No prevertebral fluid or swelling. No visible canal hematoma. Disc levels:  Mild degenerative narrowing at C6-7, unchanged. Upper chest: Lung apices are clear. Other: None. IMPRESSION: Normal alignment.  No acute displaced fractures identified. Electronically Signed   By: Lucienne Capers M.D.   On: 07/15/2022 03:13   CT HEAD WO CONTRAST (5MM)  Result Date: 07/15/2022 CLINICAL DATA:  Numbness and sagging sensation to the  left face. EXAM: CT HEAD WITHOUT CONTRAST TECHNIQUE: Contiguous axial images were obtained from the base of the skull through the vertex without intravenous contrast. RADIATION DOSE REDUCTION: This exam was performed according to the departmental dose-optimization program which includes automated exposure control, adjustment of the mA and/or kV according to patient size and/or use of iterative reconstruction technique. COMPARISON:  CT 05/25/2022.  MRI 04/09/2022 FINDINGS: Brain: No evidence of acute infarction, hemorrhage, hydrocephalus, extra-axial collection or mass lesion/mass effect. Vascular: No hyperdense vessel or unexpected calcification. Skull: Normal. Negative for fracture or focal lesion. Sinuses/Orbits: Mild  mucosal thickening in the paranasal sinuses. No acute air-fluid levels. Mastoid air cells are clear. Other: None. IMPRESSION: No acute intracranial abnormalities. Electronically Signed   By: Lucienne Capers M.D.   On: 07/15/2022 03:11    PROCEDURES and INTERVENTIONS:  .1-3 Lead EKG Interpretation  Performed by: Vladimir Crofts, MD Authorized by: Vladimir Crofts, MD     Interpretation: abnormal     ECG rate:  102   ECG rate assessment: tachycardic     Rhythm: sinus tachycardia     Ectopy: none     Conduction: normal     Medications - No data to display   IMPRESSION / MDM / [place] / ED COURSE  I reviewed the triage vital signs and the nursing notes.  Differential diagnosis includes, but is not limited to, PE, ACS, anxiety, stroke  {Patient presents with symptoms of an acute illness or injury that is potentially life-threatening.  Anxious patient returns with more palpitations, without evidence of acute pathology and suitable for outpatient management.  CT without dysrhythmias or concerning interval changes.  Strongly requesting a CT scan, so we do a CT of her head and neck without evidence of acute pathology.  Considering her extensive recent work-up I do not see any indications for further serum diagnostics.  Discharged with return precautions.      FINAL CLINICAL IMPRESSION(S) / ED DIAGNOSES   Final diagnoses:  Palpitations     Rx / DC Orders   ED Discharge Orders     None        Note:  This document was prepared using Dragon voice recognition software and may include unintentional dictation errors.   Vladimir Crofts, MD 07/15/22 [phone]

## 2022-07-23 ENCOUNTER — Emergency Department: Payer: Medicaid Other

## 2022-07-23 ENCOUNTER — Other Ambulatory Visit: Payer: Self-pay

## 2022-07-23 ENCOUNTER — Encounter: Payer: Self-pay | Admitting: Emergency Medicine

## 2022-07-23 ENCOUNTER — Emergency Department
Admission: EM | Admit: 2022-07-23 | Discharge: 2022-07-23 | Disposition: A | Discharge status: Home / Self Care | Payer: Medicaid Other | Attending: Emergency Medicine | Admitting: Emergency Medicine

## 2022-07-23 DIAGNOSIS — R000 Tachycardia, unspecified: Secondary | ICD-10-CM | POA: Insufficient documentation

## 2022-07-23 DIAGNOSIS — Z7982 Long term (current) use of aspirin: Secondary | ICD-10-CM | POA: Diagnosis not present

## 2022-07-23 DIAGNOSIS — R0789 Other chest pain: Secondary | ICD-10-CM

## 2022-07-23 DIAGNOSIS — R0602 Shortness of breath: Secondary | ICD-10-CM | POA: Diagnosis not present

## 2022-07-23 LAB — CBC
HCT: 48.5 % (ref 39.0–52.0)
Hemoglobin: 16.8 g/dL (ref 13.0–17.0)
MCH: 29.5 pg (ref 26.0–34.0)
MCHC: 34.6 g/dL (ref 30.0–36.0)
MCV: 85.1 fL (ref 80.0–100.0)
Platelets: 318 10*3/uL (ref 150–400)
RBC: 5.7 MIL/uL (ref 4.22–5.81)
RDW: 12.5 % (ref 11.5–15.5)
WBC: 9.4 10*3/uL (ref 4.0–10.5)
nRBC: 0 % (ref 0.0–0.2)

## 2022-07-23 LAB — TROPONIN I (HIGH SENSITIVITY)
Troponin I (High Sensitivity): <2 ng/L (ref <18)
Troponin I (High Sensitivity): <2 ng/L (ref <18)

## 2022-07-23 LAB — BASIC METABOLIC PANEL
Anion gap: 8 (ref 5–15)
BUN: 13 mg/dL (ref 6–20)
CO2: 27 mmol/L (ref 22–32)
Calcium: 9.7 mg/dL (ref 8.9–10.3)
Chloride: 103 mmol/L (ref 98–111)
Creatinine, Ser: 0.63 mg/dL (ref 0.61–1.24)
GFR, Estimated: >60 mL/min (ref >60)
Glucose, Bld: 101 mg/dL — ABNORMAL HIGH (ref 70–99)
Potassium: 4 mmol/L (ref 3.5–5.1)
Sodium: 138 mmol/L (ref 135–145)

## 2022-07-23 LAB — D-DIMER, QUANTITATIVE: D-Dimer, Quant: <0.27 ug/mL-FEU (ref 0.00–0.50)

## 2022-07-23 MED ORDER — ALUM & MAG HYDROXIDE-SIMETH 200-200-20 MG/5ML PO SUSP
30.0000 mL | Freq: Once | ORAL | Status: AC
  Administered 2022-07-23: 30 mL via ORAL
  Filled 2022-07-23: qty 30

## 2022-07-23 MED ORDER — SERTRALINE HCL 25 MG PO TABS: 25.0000 mg | ORAL_TABLET | Freq: Every day | ORAL | 0 refills | Status: AC

## 2022-07-23 MED ORDER — ACETAMINOPHEN 500 MG PO TABS
1000.0000 mg | ORAL_TABLET | Freq: Once | ORAL | Status: AC
  Administered 2022-07-23: 1000 mg via ORAL
  Filled 2022-07-23: qty 2

## 2022-07-23 NOTE — Discharge Instructions (Addendum)
Follow-up with cardiology as planned.  I have written a new prescription for Zoloft.  Important that your mental health is regulated as this can also affect your heart and your breathing.  Please follow-up with your therapist and restart your Zoloft  and return to the ER if you develop worsening symptoms or any other concerns

## 2022-07-23 NOTE — ED Provider Notes (Signed)
[facility] Provider Note    Event Date/Time   First MD Initiated Contact with Patient 07/23/22 1008     (approximate)   History   Chest Pain and Shortness of Breath   HPI  Gerald Olsen is a 26 y.o. adult  male to male transgender who comes in with chest pain and SOB that started this morning.  Patient reports the discomfort woke her up from sleep.  She took 3-81 aspirin it seems to better.  She reports just being worried that there was something more serious going on.  Denies any history of blood clots not on any hormone therapy.  Does report a history of anxiety but is not been taking Zoloft due to losing it.  Denies any SI or HI.  Physical Exam   Triage Vital Signs: ED Triage Vitals  Enc Vitals Group     BP 07/23/22 0707 97/83     Pulse Rate 07/23/22 0707 (!) 109     Resp 07/23/22 0707 20     Temp 07/23/22 0707 98.7 F (37.1 C)     Temp Source 07/23/22 0707 Oral     SpO2 07/23/22 0707 97 %     Weight 07/23/22 0705 182 lb (82.6 kg)     Height 07/23/22 0705 5\' 8"  (1.727 m)     Head Circumference --      Peak Flow --      Pain Score --      Pain Loc --      Pain Edu? --      Excl. in [place]? --     Most recent vital signs: Vitals:   07/23/22 0707  BP: 97/83  Pulse: (!) 109  Resp: 20  Temp: 98.7 F (37.1 C)  SpO2: 97%     General: Awake, no distress.  CV:  Good peripheral perfusion.  Resp:  Normal effort.  Abd:  No distention.  Soft and nontender Other:  No swelling in legs.  No calf tenderness.  No chest wall tenderness.  Good distal pulses.  No rash noted on the chest wall.   ED Results / Procedures / Treatments   Labs (all labs ordered are listed, but only abnormal results are displayed) Labs Reviewed  BASIC METABOLIC PANEL - Abnormal; Notable for the following components:      Result Value   Glucose, Bld 101 (*)    All other components within normal limits  CBC  TROPONIN I (HIGH SENSITIVITY)  TROPONIN I (HIGH  SENSITIVITY)     EKG  My interpretation of EKG:  Sinus tachycardia rate of 106 without any ST elevation or T wave inversions, normal intervals.  RADIOLOGY I have reviewed the xray personally and interpreted and no evidence of any pneumonia   PROCEDURES:  Critical Care performed: No  Procedures   MEDICATIONS ORDERED IN ED: Medications - No data to display   IMPRESSION / MDM / [place] / ED COURSE  I reviewed the triage vital signs and the nursing notes.   Patient's presentation is most consistent with acute presentation with potential threat to life or bodily function.   Differential is ACS, PE, anxiety, acid reflux.  Abdomen soft and nontender no suspicion for pancreatitis or cholecystitis.  Troponin is negative.  BMP is normal.  CBC normal.  Will get repeat troponin and repeat D-dimer  D-dimer is negative repeat troponin is negative.  Patient reports feeling better.  She already has cardiology follow-up for this month.  She  feels comfortable with discharge home and return to the ER if she develops worsening symptoms or any other concern.  She is requesting a refill of her Zoloft due to her losing the current prescription.  We will give her a new prescription for this.   FINAL CLINICAL IMPRESSION(S) / ED DIAGNOSES   Final diagnoses:  Atypical chest pain     Rx / DC Orders   ED Discharge Orders     None        Note:  This document was prepared using Dragon voice recognition software and may include unintentional dictation errors.   Concha Se, MD 07/23/22 1125

## 2022-07-23 NOTE — ED Triage Notes (Signed)
Pt reports an hour ago started with cp to his med chest burning in nature that radiates into his left arm. Pt reports some SOB with it and states he took 3 of the 81mg  asa.

## 2022-08-28 ENCOUNTER — Encounter: Payer: Self-pay | Admitting: Emergency Medicine

## 2022-08-28 ENCOUNTER — Emergency Department: Payer: Medicaid Other

## 2022-08-28 ENCOUNTER — Emergency Department
Admission: EM | Admit: 2022-08-28 | Discharge: 2022-08-28 | Disposition: A | Discharge status: Home / Self Care | Payer: Medicaid Other | Attending: Emergency Medicine | Admitting: Emergency Medicine

## 2022-08-28 ENCOUNTER — Other Ambulatory Visit: Payer: Self-pay

## 2022-08-28 DIAGNOSIS — Z1152 Encounter for screening for COVID-19: Secondary | ICD-10-CM | POA: Diagnosis not present

## 2022-08-28 DIAGNOSIS — J45909 Unspecified asthma, uncomplicated: Secondary | ICD-10-CM | POA: Diagnosis not present

## 2022-08-28 DIAGNOSIS — R079 Chest pain, unspecified: Secondary | ICD-10-CM | POA: Insufficient documentation

## 2022-08-28 DIAGNOSIS — R112 Nausea with vomiting, unspecified: Secondary | ICD-10-CM | POA: Insufficient documentation

## 2022-08-28 DIAGNOSIS — R1084 Generalized abdominal pain: Secondary | ICD-10-CM | POA: Diagnosis present

## 2022-08-28 DIAGNOSIS — D72829 Elevated white blood cell count, unspecified: Secondary | ICD-10-CM | POA: Diagnosis not present

## 2022-08-28 DIAGNOSIS — D751 Secondary polycythemia: Secondary | ICD-10-CM | POA: Insufficient documentation

## 2022-08-28 DIAGNOSIS — R197 Diarrhea, unspecified: Secondary | ICD-10-CM | POA: Insufficient documentation

## 2022-08-28 LAB — URINALYSIS, ROUTINE W REFLEX MICROSCOPIC
Bacteria, UA: NONE SEEN
Bilirubin Urine: NEGATIVE
Glucose, UA: NEGATIVE mg/dL
Hgb urine dipstick: NEGATIVE
Ketones, ur: 20 mg/dL — AB
Leukocytes,Ua: NEGATIVE
Nitrite: NEGATIVE
Protein, ur: 100 mg/dL — AB
Specific Gravity, Urine: 1.025 (ref 1.005–1.030)
Squamous Epithelial / HPF: NONE SEEN (ref 0–5)
pH: 7 (ref 5.0–8.0)

## 2022-08-28 LAB — LIPASE, BLOOD: Lipase: 36 U/L (ref 11–51)

## 2022-08-28 LAB — CBC
HCT: 48.5 % (ref 39.0–52.0)
Hemoglobin: 17.2 g/dL — ABNORMAL HIGH (ref 13.0–17.0)
MCH: 29.4 pg (ref 26.0–34.0)
MCHC: 35.5 g/dL (ref 30.0–36.0)
MCV: 82.9 fL (ref 80.0–100.0)
Platelets: 317 10*3/uL (ref 150–400)
RBC: 5.85 MIL/uL — ABNORMAL HIGH (ref 4.22–5.81)
RDW: 12.8 % (ref 11.5–15.5)
WBC: 11.2 10*3/uL — ABNORMAL HIGH (ref 4.0–10.5)
nRBC: 0 % (ref 0.0–0.2)

## 2022-08-28 LAB — BASIC METABOLIC PANEL
Anion gap: 9 (ref 5–15)
BUN: 16 mg/dL (ref 6–20)
CO2: 22 mmol/L (ref 22–32)
Calcium: 9.9 mg/dL (ref 8.9–10.3)
Chloride: 108 mmol/L (ref 98–111)
Creatinine, Ser: 0.68 mg/dL (ref 0.61–1.24)
GFR, Estimated: >60 mL/min (ref >60)
Glucose, Bld: 126 mg/dL — ABNORMAL HIGH (ref 70–99)
Potassium: 3.8 mmol/L (ref 3.5–5.1)
Sodium: 139 mmol/L (ref 135–145)

## 2022-08-28 LAB — TROPONIN I (HIGH SENSITIVITY): Troponin I (High Sensitivity): <2 ng/L (ref <18)

## 2022-08-28 LAB — RESP PANEL BY RT-PCR (FLU A&B, COVID) ARPGX2
Influenza A by PCR: NEGATIVE
Influenza B by PCR: NEGATIVE
SARS Coronavirus 2 by RT PCR: NEGATIVE

## 2022-08-28 LAB — D-DIMER, QUANTITATIVE: D-Dimer, Quant: <0.27 ug/mL-FEU (ref 0.00–0.50)

## 2022-08-28 MED ORDER — ONDANSETRON 4 MG PO TBDP
4.0000 mg | ORAL_TABLET | Freq: Once | ORAL | Status: AC
  Administered 2022-08-28: 4 mg via ORAL
  Filled 2022-08-28: qty 1

## 2022-08-28 MED ORDER — ACETAMINOPHEN 500 MG PO TABS
1000.0000 mg | ORAL_TABLET | Freq: Once | ORAL | Status: AC
  Administered 2022-08-28: 1000 mg via ORAL
  Filled 2022-08-28: qty 2

## 2022-08-28 NOTE — ED Triage Notes (Signed)
Patient ambulatory to triage, reports abdominal pain, chest pain, throat pain, upper and lower back pain, six episodes of vomiting and temperature of 100.5. Reports palpitations, blurred vision, pain with breathing.

## 2022-08-28 NOTE — ED Provider Notes (Signed)
[facility] Provider Note    Event Date/Time   First MD Initiated Contact with Patient 08/28/22 [phone]     (approximate)   History   Abdominal Pain, Emesis, and Chest Pain   HPI  Gerald Olsen is a 26 y.o. adult past medical history of asthma ADHD anxiety Tourette syndrome who presents with abdominal pain nausea vomiting diarrhea.  Symptoms started last night.  Patient vomited 6 times back-to-back nonbloody.  Has also had 3 episodes of nonbloody diarrhea.  Initially was having some generalized abdominal pain rating to the chest and back pain this is since resolved.  Patient not tolerating p.o.  Also had fever 100.5.  Denies urinary symptoms did have some dyspnea earlier this is resolved no cough.     Past Medical History:  Diagnosis Date   ADHD    Anxiety    Asthma    Chest pain    Tourette's     Patient Active Problem List   Diagnosis Date Noted   GAD (generalized anxiety disorder) 06/22/2022   Depression 04/15/2019   Chest pain 04/09/2013     Physical Exam  Triage Vital Signs: ED Triage Vitals  Enc Vitals Group     BP 08/28/22 0428 122/80     Pulse Rate 08/28/22 0428 (!) 120     Resp 08/28/22 0428 20     Temp 08/28/22 0428 99.9 F (37.7 C)     Temp Source 08/28/22 0428 Oral     SpO2 08/28/22 0428 97 %     Weight 08/28/22 0426 189 lb (85.7 kg)     Height 08/28/22 0426 5\' 8"  (1.727 m)     Head Circumference --      Peak Flow --      Pain Score 08/28/22 0425 10     Pain Loc --      Pain Edu? --      Excl. in GC? --     Most recent vital signs: Vitals:   08/28/22 0949 08/28/22 1100  BP: 120/78 110/65  Pulse: (!) 110 98  Resp: 20 20  Temp: 99 F (37.2 C) 99.1 F (37.3 C)  SpO2: 98% 96%     General: Awake, no distress.  CV:  Good peripheral perfusion.  Resp:  Normal effort.  Abd:  No distention.  Abdomen is soft nontender throughout Neuro:             Awake, Alert, Oriented x 3  Other:     ED Results / Procedures /  Treatments  Labs (all labs ordered are listed, but only abnormal results are displayed) Labs Reviewed  BASIC METABOLIC PANEL - Abnormal; Notable for the following components:      Result Value   Glucose, Bld 126 (*)    All other components within normal limits  CBC - Abnormal; Notable for the following components:   WBC 11.2 (*)    RBC 5.85 (*)    Hemoglobin 17.2 (*)    All other components within normal limits  URINALYSIS, ROUTINE W REFLEX MICROSCOPIC - Abnormal; Notable for the following components:   Color, Urine YELLOW (*)    APPearance CLEAR (*)    Ketones, ur 20 (*)    Protein, ur 100 (*)    All other components within normal limits  RESP PANEL BY RT-PCR (FLU A&B, COVID) ARPGX2  LIPASE, BLOOD  D-DIMER, QUANTITATIVE  TROPONIN I (HIGH SENSITIVITY)  TROPONIN I (HIGH SENSITIVITY)     EKG  EKG shows  sinus tach, right bundle branch block no acute schema changes   RADIOLOGY I reviewed and interpreted the CXR which does not show any acute cardiopulmonary process    PROCEDURES:  Critical Care performed:  .1-3 Lead EKG Interpretation  Performed by: Georga Hacking, MD Authorized by: Georga Hacking, MD     Interpretation: abnormal     ECG rate assessment: tachycardic     Rhythm: sinus tachycardia     Ectopy: none     Conduction: normal     The patient is on the cardiac monitor to evaluate for evidence of arrhythmia and/or significant heart rate changes.   MEDICATIONS ORDERED IN ED: Medications  ondansetron (ZOFRAN-ODT) disintegrating tablet 4 mg (4 mg Oral Given 08/28/22 1119)  acetaminophen (TYLENOL) tablet 1,000 mg (1,000 mg Oral Given 08/28/22 1118)     IMPRESSION / MDM / ASSESSMENT AND PLAN / ED COURSE  I reviewed the triage vital signs and the nursing notes.                              Patient's presentation is most consistent with acute complicated illness / injury requiring diagnostic workup.  Differential diagnosis includes, but is not  limited to, viral gastroenteritis, COVID, influenza, knee stone, pulmonary embolism, pneumonia   Patient is a 26 year old who presents with nausea vomiting diarrhea abdominal pain.  Symptoms started last night.  Was had 6 total episodes of vomiting 2 episodes of nonbloody diarrhea.  Initially had abdominal and back pain radiating to the chest but by the time of my evaluation this has significantly improved.  Also had fever 100.5 at home.  Patient looks well initially was tachycardic but this resolved after p.o. hydration and ODT Zofran.  Abdominal exam is benign.   Labs reveal leukocytosis and polycythemia likely from hemoconcentration.  UA normal BMP reassuring.  Lipase normal.  I have low suspicion for acute abdomen based on abdominal exam.  Patient offered IV fluids given the tachycardia and likely dehydration but he preferred p.o. rehydration.     FINAL CLINICAL IMPRESSION(S) / ED DIAGNOSES   Final diagnoses:  Nausea vomiting and diarrhea     Rx / DC Orders   ED Discharge Orders     None        Note:  This document was prepared using Dragon voice recognition software and may include unintentional dictation errors.   Georga Hacking, MD 08/28/22 1153

## 2022-08-28 NOTE — Discharge Instructions (Signed)
Your blood work was reassuring.  You likely have a virus causing the nausea vomiting diarrhea.  Please stick to liquids while you are still feeling sick and you can slowly increase to bland solids as you feel better.

## 2022-09-09 ENCOUNTER — Emergency Department: Payer: Medicaid Other

## 2022-09-09 ENCOUNTER — Emergency Department
Admission: EM | Admit: 2022-09-09 | Discharge: 2022-09-09 | Disposition: A | Discharge status: Home / Self Care | Payer: Medicaid Other | Attending: Emergency Medicine | Admitting: Emergency Medicine

## 2022-09-09 DIAGNOSIS — Z7982 Long term (current) use of aspirin: Secondary | ICD-10-CM | POA: Diagnosis not present

## 2022-09-09 DIAGNOSIS — R0602 Shortness of breath: Secondary | ICD-10-CM | POA: Diagnosis not present

## 2022-09-09 DIAGNOSIS — R29810 Facial weakness: Secondary | ICD-10-CM | POA: Diagnosis not present

## 2022-09-09 DIAGNOSIS — J45909 Unspecified asthma, uncomplicated: Secondary | ICD-10-CM | POA: Insufficient documentation

## 2022-09-09 DIAGNOSIS — R42 Dizziness and giddiness: Secondary | ICD-10-CM | POA: Diagnosis not present

## 2022-09-09 DIAGNOSIS — R519 Headache, unspecified: Secondary | ICD-10-CM | POA: Insufficient documentation

## 2022-09-09 DIAGNOSIS — R0789 Other chest pain: Secondary | ICD-10-CM

## 2022-09-09 HISTORY — DX: Obsessive-compulsive disorder, unspecified: F42.9

## 2022-09-09 LAB — COMPREHENSIVE METABOLIC PANEL
ALT: 90 U/L — ABNORMAL HIGH (ref 0–44)
AST: 37 U/L (ref 15–41)
Albumin: 4.8 g/dL (ref 3.5–5.0)
Alkaline Phosphatase: 63 U/L (ref 38–126)
Anion gap: 8 (ref 5–15)
BUN: 13 mg/dL (ref 6–20)
CO2: 25 mmol/L (ref 22–32)
Calcium: 9.9 mg/dL (ref 8.9–10.3)
Chloride: 104 mmol/L (ref 98–111)
Creatinine, Ser: 0.63 mg/dL (ref 0.61–1.24)
GFR, Estimated: >60 mL/min (ref >60)
Glucose, Bld: 95 mg/dL (ref 70–99)
Potassium: 3.6 mmol/L (ref 3.5–5.1)
Sodium: 137 mmol/L (ref 135–145)
Total Bilirubin: 1 mg/dL (ref 0.3–1.2)
Total Protein: 7.9 g/dL (ref 6.5–8.1)

## 2022-09-09 LAB — CBC
HCT: 48.8 % (ref 39.0–52.0)
Hemoglobin: 17 g/dL (ref 13.0–17.0)
MCH: 29.2 pg (ref 26.0–34.0)
MCHC: 34.8 g/dL (ref 30.0–36.0)
MCV: 83.7 fL (ref 80.0–100.0)
Platelets: 365 10*3/uL (ref 150–400)
RBC: 5.83 MIL/uL — ABNORMAL HIGH (ref 4.22–5.81)
RDW: 12.8 % (ref 11.5–15.5)
WBC: 8.2 10*3/uL (ref 4.0–10.5)
nRBC: 0 % (ref 0.0–0.2)

## 2022-09-09 NOTE — ED Triage Notes (Signed)
BIBA [facility].   Reported SOB to EMS on arrival and hx of anxiety. Stated that he was playing his video game and when he stood up he got SOB and tight in his chest.   123/84 98% RA  HR 89 BGL 91

## 2022-09-09 NOTE — Discharge Instructions (Addendum)
Please continue your sertraline and hydroxyzine as prescribed.   Please go to the following website to schedule new (and existing) patient appointments:   [email or web address]   The following is a list of primary care offices in the area who are accepting new patients at this time.  Please reach out to one of them directly and let them know you would like to schedule an appointment to follow up on an Emergency Department visit, and/or to establish a new primary care provider (PCP).  There are likely other primary care clinics in the are who are accepting new patients, but this is an excellent place to start:  [facility] Lead physician: Dr Shirlee Latch [address] [place], [place] [address] [phone]  [facility] Lead Physician: Dr Alba Cory [address], [place], [place] [address] [phone]  [facility]  Lead Physician: Dr Olevia Perches [address] [place], [place] [address] [phone]  [facility] Lead Physician: Dr Sofie Hartigan [address], [place], [place] [address] [phone]  [facility] Lead Physician: Dr Bari Edward [address] [place], [place], [place] [address] [phone]

## 2022-09-09 NOTE — ED Notes (Signed)
Patient transported to X-ray 

## 2022-09-09 NOTE — ED Provider Notes (Addendum)
[facility] Provider Note    Event Date/Time   First MD Initiated Contact with Patient 09/09/22 0501     (approximate)   History   Shortness of Breath   HPI  Gerald Olsen is a 26 y.o. adult with history of Tourette's, ADHD, anxiety, asthma who presents emergency department shortness of breath and chest tightness that started after playing a video game.  Took Vistaril prior to arrival and symptoms are improving.  No fevers or cough.  Also complains of chronic intermittent dizziness, facial drooping, headaches and has had MRI of the brain which was unremarkable in September.  No new focal neurologic deficits today.   History provided by patient.    Past Medical History:  Diagnosis Date   ADHD    Anxiety    Asthma    Chest pain    OCD (obsessive compulsive disorder)    Tourette's     Past Surgical History:  Procedure Laterality Date   INGUINAL HERNIA REPAIR  Infant   As Infant    MEDICATIONS:  Prior to Admission medications   Medication Sig Start Date End Date Taking? Authorizing Provider  albuterol (VENTOLIN HFA) 108 (90 Base) MCG/ACT inhaler Inhale 2 puffs into the lungs every 6 (six) hours as needed for wheezing or shortness of breath. 02/28/22   Chesley Noon, MD  ARIPiprazole (ABILIFY) 2 MG tablet Take by mouth. 03/16/22   [provider]  aspirin EC 81 MG tablet Take by mouth.    [provider]  busPIRone (BUSPAR) 10 MG tablet Take 10 mg by mouth 2 (two) times daily. 05/24/22   [provider]  clonazePAM Scarlette Calico) 0.5 MG tablet Take by mouth. 05/15/22   [provider]  fluvoxaMINE (LUVOX) 100 MG tablet Take 100 mg by mouth 2 (two) times daily. 01/04/22   [provider]  hydrOXYzine (ATARAX) 50 MG tablet Take by mouth.    [provider]  LORazepam (ATIVAN) 0.5 MG tablet Take 0.5 mg by mouth daily as needed. 01/13/22   [provider]  metoCLOPramide (REGLAN) 10 MG tablet  Take 1 tablet (10 mg total) by mouth 3 (three) times daily with meals. 07/06/22 07/06/23  Delton Prairie, MD  pantoprazole (PROTONIX) 20 MG tablet Take by mouth. 02/08/22   [provider]  phenol (CHLORASEPTIC MOUTH PAIN) 1.4 % LIQD Use as directed 1 spray in the mouth or throat as needed for throat irritation / pain. 02/11/22   Merwyn Katos, MD  QUEtiapine (SEROQUEL) 50 MG tablet Take 50 mg by mouth at bedtime. 03/22/22   [provider]  sertraline (ZOLOFT) 25 MG tablet Take 1 tablet (25 mg total) by mouth daily. 07/23/22 08/22/22  Concha Se, MD  topiramate (TOPAMAX) 50 MG tablet Take 50 mg by mouth at bedtime. 04/05/22   [provider]    Physical Exam   Triage Vital Signs: ED Triage Vitals  Enc Vitals Group     BP 09/09/22 0355 (!) 129/91     Pulse Rate 09/09/22 0355 90     Resp 09/09/22 0355 17     Temp 09/09/22 0355 99 F (37.2 C)     Temp Source 09/09/22 0355 Oral     SpO2 09/09/22 0355 97 %     Weight 09/09/22 0359 189 lb (85.7 kg)     Height 09/09/22 0359 5\' 8"  (1.727 m)     Head Circumference --      Peak Flow --  Pain Score 09/09/22 0359 0     Pain Loc --      Pain Edu? --      Excl. in GC? --     Most recent vital signs: Vitals:   09/09/22 0500 09/09/22 0526  BP: 127/81 120/78  Pulse: 74 78  Resp:  16  Temp:    SpO2: 98% 98%    CONSTITUTIONAL: Alert and oriented and responds appropriately to questions. Well-appearing; well-nourished HEAD: Normocephalic, atraumatic EYES: Conjunctivae clear, pupils appear equal, sclera nonicteric ENT: normal nose; moist mucous membranes NECK: Supple, normal ROM CARD: RRR; S1 and S2 appreciated; no murmurs, no clicks, no rubs, no gallops RESP: Normal chest excursion without splinting or tachypnea; breath sounds clear and equal bilaterally; no wheezes, no rhonchi, no rales, no hypoxia or respiratory distress, speaking full sentences ABD/GI: Normal bowel sounds; non-distended; soft, non-tender,  no rebound, no guarding, no peritoneal signs BACK: The back appears normal EXT: Normal ROM in all joints; no deformity noted, no edema; no cyanosis SKIN: Normal color for age and race; warm; no rash on exposed skin NEURO: Moves all extremities equally, normal speech, no facial asymmetry PSYCH: The patient's mood and manner are appropriate.   ED Results / Procedures / Treatments   LABS: (all labs ordered are listed, but only abnormal results are displayed) Labs Reviewed  COMPREHENSIVE METABOLIC PANEL - Abnormal; Notable for the following components:      Result Value   ALT 90 (*)    All other components within normal limits  CBC - Abnormal; Notable for the following components:   RBC 5.83 (*)    All other components within normal limits     EKG:  EKG Interpretation  Date/Time:  Saturday September 09 2022 03:57:26 EST Ventricular Rate:  88 PR Interval:  136 QRS Duration: 98 QT Interval:  340 QTC Calculation: 411 R Axis:   63 Text Interpretation: Normal sinus rhythm Incomplete right bundle branch block Borderline ECG When compared with ECG of 28-Aug-2022 04:24, No significant change was found Confirmed by Rochele Raring [phone]) on 09/09/2022 4:46:21 AM         RADIOLOGY: My personal review and interpretation of imaging: Chest x-ray clear.  I have personally reviewed all radiology reports.   DG Chest 2 View  Result Date: 09/09/2022 CLINICAL DATA:  Shortness of breath and chest pain. EXAM: CHEST - 2 VIEW COMPARISON:  PA Lat 08/28/2022 FINDINGS: The heart size and mediastinal contours are within normal limits. Both lungs are clear. The visualized skeletal structures are unremarkable. IMPRESSION: No evidence of acute chest disease or interval changes. Electronically Signed   By: Almira Bar M.D.   On: 09/09/2022 05:30     PROCEDURES:  Critical Care performed: No      Procedures    IMPRESSION / MDM / ASSESSMENT AND PLAN / ED COURSE  I reviewed the triage vital  signs and the nursing notes.    Patient here with atypical chest pain.  Well-known to our emergency department with 20 visits in the past 6 months.  The patient is on the cardiac monitor to evaluate for evidence of arrhythmia and/or significant heart rate changes.   DIFFERENTIAL DIAGNOSIS (includes but not limited to):   Anxiety, doubt ACS, PE, dissection, pneumonia, pneumothorax, CHF   Patient's presentation is most consistent with acute presentation with potential threat to life or bodily function.   PLAN: Basic labs obtained from triage.  No leukocytosis, normal hemoglobin, normal electrolytes and renal function.  EKG nonischemic.  Chest x-ray reviewed and interpreted by myself and the radiologist and shows no acute abnormality.  Patient feeling better after Vistaril.  Recommended close follow-up with outpatient providers.  No SI or HI today.  Also complaining of intermittent headaches and facial droop and dizziness but no neurologic deficits today and has had negative MRA of the head and neck in September 2023.  Also negative MRI of the brain with and without contrast in July 2023.  I do not feel there is any indication for further emergent neurologic workup given the symptoms have been ongoing for months.  Has also had negative EEG in September 2023.  Followed by Dr. Malvin Johns as an outpatient with neurology.   MEDICATIONS GIVEN IN ED: Medications - No data to display   ED COURSE:  At this time, I do not feel there is any life-threatening condition present. I reviewed all nursing notes, vitals, pertinent previous records.  All lab and urine results, EKGs, imaging ordered have been independently reviewed and interpreted by myself.  I reviewed all available radiology reports from any imaging ordered this visit.  Based on my assessment, I feel the patient is safe to be discharged home without further emergent workup and can continue workup as an outpatient as needed. Discussed all findings,  treatment plan as well as usual and customary return precautions.  They verbalize understanding and are comfortable with this plan.  Outpatient follow-up has been provided as needed.  All questions have been answered.    CONSULTS:  none   OUTSIDE RECORDS REVIEWED: Patient was last seen by neurology on 06/17/2022 for EEG which was normal.       FINAL CLINICAL IMPRESSION(S) / ED DIAGNOSES   Final diagnoses:  Atypical chest pain     Rx / DC Orders   ED Discharge Orders     None        Note:  This document was prepared using Dragon voice recognition software and may include unintentional dictation errors.   Alonia Dibuono, Layla Maw, DO 09/09/22 0527    Libni Fusaro, Layla Maw, DO 09/09/22 [phone]

## 2022-09-09 NOTE — ED Triage Notes (Signed)
Pt from home BIB [facility], pt c/o SOB "out of body experience" and CP after standing up from playing video games. Reports the past year feels like "my face has been drooping, weird head pain in one section that travels to my right eye". St a few days out the week I have tinging sensations. Reports "I feel sluggish and uncoordinated" Pt has hx of anxiety. Hx of OD earlier this year. Denies SI/HI at current. Reports fell a month ago, hitting the back of his head, denied LOC.Pt would like to be evaluated for asthma as well, reports out of her inhaler and feels that this could also be an asthma flare up. RR even and unlabored, no wheezing noted, pt able to talk in complete sentences.

## 2022-11-16 ENCOUNTER — Emergency Department
Admission: EM | Admit: 2022-11-16 | Discharge: 2022-11-16 | Disposition: A | Discharge status: Home / Self Care | Payer: Medicaid Other | Attending: Emergency Medicine | Admitting: Emergency Medicine

## 2022-11-16 ENCOUNTER — Other Ambulatory Visit: Payer: Self-pay

## 2022-11-16 ENCOUNTER — Emergency Department: Payer: Medicaid Other

## 2022-11-16 DIAGNOSIS — R0789 Other chest pain: Secondary | ICD-10-CM

## 2022-11-16 LAB — BASIC METABOLIC PANEL
Anion gap: 8 (ref 5–15)
BUN: 11 mg/dL (ref 6–20)
CO2: 27 mmol/L (ref 22–32)
Calcium: 9.9 mg/dL (ref 8.9–10.3)
Chloride: 104 mmol/L (ref 98–111)
Creatinine, Ser: 0.58 mg/dL — ABNORMAL LOW (ref 0.61–1.24)
GFR, Estimated: >60 mL/min (ref >60)
Glucose, Bld: 94 mg/dL (ref 70–99)
Potassium: 4 mmol/L (ref 3.5–5.1)
Sodium: 139 mmol/L (ref 135–145)

## 2022-11-16 LAB — CBC
HCT: 45.1 % (ref 39.0–52.0)
Hemoglobin: 15.7 g/dL (ref 13.0–17.0)
MCH: 29.2 pg (ref 26.0–34.0)
MCHC: 34.8 g/dL (ref 30.0–36.0)
MCV: 84 fL (ref 80.0–100.0)
Platelets: 273 10*3/uL (ref 150–400)
RBC: 5.37 MIL/uL (ref 4.22–5.81)
RDW: 12.9 % (ref 11.5–15.5)
WBC: 7.8 10*3/uL (ref 4.0–10.5)
nRBC: 0 % (ref 0.0–0.2)

## 2022-11-16 LAB — TROPONIN I (HIGH SENSITIVITY): Troponin I (High Sensitivity): <2 ng/L (ref <18)

## 2022-11-16 NOTE — ED Triage Notes (Addendum)
Pt to ED via POV c/o chest pain. Pt states feeling pain for past few weeks. No active pain at this time. Pain in mid chest and left side. Pt with hx anxiety. States feeling "uncoordinated" when not taking medication/off schedule with medication. NAD at this time, denies SOB.

## 2022-11-16 NOTE — ED Provider Notes (Signed)
[facility] Provider Note    Event Date/Time   First MD Initiated Contact with Patient 11/16/22 2302     (approximate)   History   Chief Complaint Chest Pain   HPI  Gerald Olsen is a 27 y.o. adult male who identifies as male who presents to the ED complaining of chest pain.  Patient reports that she developed tightness and pressure in the center of her chest approximately 4 hours prior to arrival while sitting and watching TV.  She denies any associated difficulty breathing and had been feeling well prior to the onset of symptoms with no fevers, cough, leg swelling or pain.  Chest pain has resolved since she has arrived to the ED.  She reports similar episodes in the past for which she has been seen in the ED with unremarkable workup.  She states she was more concerned this time because she felt like her coordination was off with "both sides of my lower lip being pulled down."  She states that the coordination has since improved and she denies any vision changes, speech changes, numbness, or weakness.     Physical Exam   Triage Vital Signs: ED Triage Vitals  Enc Vitals Group     BP 11/16/22 2045 135/85     Pulse Rate 11/16/22 2045 (!) 105     Resp 11/16/22 2045 20     Temp 11/16/22 2045 98.4 F (36.9 C)     Temp Source 11/16/22 2045 Oral     SpO2 11/16/22 2045 99 %     Weight --      Height --      Head Circumference --      Peak Flow --      Pain Score 11/16/22 2046 0     Pain Loc --      Pain Edu? --      Excl. in [place]? --     Most recent vital signs: Vitals:   11/16/22 2045  BP: 135/85  Pulse: (!) 105  Resp: 20  Temp: 98.4 F (36.9 C)  SpO2: 99%    Constitutional: Alert and oriented. Eyes: Conjunctivae are normal. Head: Atraumatic. Nose: No congestion/rhinnorhea. Mouth/Throat: Mucous membranes are moist.  Cardiovascular: Normal rate, regular rhythm. Grossly normal heart sounds.  2+ radial pulses bilaterally. Respiratory:  Normal respiratory effort.  No retractions. Lungs CTAB. Gastrointestinal: Soft and nontender. No distention. Musculoskeletal: No lower extremity tenderness nor edema.  Neurologic:  Normal speech and language. No gross focal neurologic deficits are appreciated.    ED Results / Procedures / Treatments   Labs (all labs ordered are listed, but only abnormal results are displayed) Labs Reviewed  BASIC METABOLIC PANEL - Abnormal; Notable for the following components:      Result Value   Creatinine, Ser 0.58 (*)    All other components within normal limits  CBC  TROPONIN I (HIGH SENSITIVITY)     EKG  ED ECG REPORT I, Blake Divine, the attending physician, personally viewed and interpreted this ECG.   Date: 11/16/2022  EKG Time: 20:47  Rate: 98  Rhythm: normal sinus rhythm  Axis: Normal  Intervals: Incomplete RBBB  ST&T Change: Normal  RADIOLOGY Chest x-ray reviewed and interpreted by me with no infiltrate, edema, or effusion.  PROCEDURES:  Critical Care performed: No  Procedures   MEDICATIONS ORDERED IN ED: Medications - No data to display   IMPRESSION / MDM / Lily / ED COURSE  I reviewed the triage  vital signs and the nursing notes.                              26 y.o. adult male who identifies as male who presents to the ED complaining of tightness and pressure in her chest starting 4 hours prior to arrival with associated drooping in her lip and poor coordination.  Patient's presentation is most consistent with acute presentation with potential threat to life or bodily function.  Differential diagnosis includes, but is not limited to, ACS, arrhythmia, PE, dissection, pneumonia, pneumothorax, anemia, electrolyte abnormality, TIA, stroke, anxiety.  Patient well-appearing and in no acute distress, vital signs remarkable for mild tachycardia but otherwise reassuring.  EKG shows no evidence of arrhythmia or ischemia, troponin is negative and  symptoms sound atypical for ACS.  Do not feel repeat Lance Bosch is indicated given onset greater than 4 hours ago with chest pain now resolved.  Remainder of labs are reassuring with no significant anemia, leukocytosis, electrolyte abnormality, or AKI.  Chest x-ray is unremarkable.  Patient reports drooping in both sides of her lower lip along with poor coordination, but no focal findings noted on neurologic exam.  Low suspicion for TIA or stroke at this time, do not feel further workup is indicated with symptoms resolving.  Patient is appropriate for discharge home with PCP follow-up, was counseled to return to the ED for new or worsening symptoms.  Patient agrees with plan.      FINAL CLINICAL IMPRESSION(S) / ED DIAGNOSES   Final diagnoses:  Atypical chest pain     Rx / DC Orders   ED Discharge Orders     None        Note:  This document was prepared using Dragon voice recognition software and may include unintentional dictation errors.   Blake Divine, MD 11/16/22 [phone]

## 2022-11-28 ENCOUNTER — Other Ambulatory Visit: Payer: Self-pay

## 2022-11-28 ENCOUNTER — Emergency Department: Payer: Medicaid Other

## 2022-11-28 ENCOUNTER — Emergency Department
Admission: EM | Admit: 2022-11-28 | Discharge: 2022-11-28 | Disposition: A | Discharge status: Home / Self Care | Payer: Medicaid Other | Attending: Emergency Medicine | Admitting: Emergency Medicine

## 2022-11-28 ENCOUNTER — Encounter: Payer: Self-pay | Admitting: Emergency Medicine

## 2022-11-28 DIAGNOSIS — R059 Cough, unspecified: Secondary | ICD-10-CM | POA: Diagnosis present

## 2022-11-28 DIAGNOSIS — J069 Acute upper respiratory infection, unspecified: Secondary | ICD-10-CM | POA: Insufficient documentation

## 2022-11-28 DIAGNOSIS — R000 Tachycardia, unspecified: Secondary | ICD-10-CM | POA: Diagnosis not present

## 2022-11-28 DIAGNOSIS — J3489 Other specified disorders of nose and nasal sinuses: Secondary | ICD-10-CM | POA: Insufficient documentation

## 2022-11-28 DIAGNOSIS — Z1152 Encounter for screening for COVID-19: Secondary | ICD-10-CM | POA: Insufficient documentation

## 2022-11-28 LAB — RESP PANEL BY RT-PCR (RSV, FLU A&B, COVID)  RVPGX2
Influenza A by PCR: NEGATIVE
Influenza B by PCR: NEGATIVE
Resp Syncytial Virus by PCR: NEGATIVE
SARS Coronavirus 2 by RT PCR: NEGATIVE

## 2022-11-28 MED ORDER — BENZONATATE 100 MG PO CAPS: 100.0000 mg | ORAL_CAPSULE | Freq: Three times a day (TID) | ORAL | 0 refills | Status: AC | PRN

## 2022-11-28 NOTE — ED Notes (Signed)
Pt signed esignature.

## 2022-11-28 NOTE — ED Provider Notes (Signed)
[facility] Provider Note  Patient Contact: 4:18 PM (approximate)   History   Cough   HPI  Gerald Olsen is a 27 y.o. adult presents to the emergency department with cough, nasal congestion, rhinorrhea, body aches and headache.  Patient reports that both parents are sick at home with similar symptoms.  No vomiting or diarrhea.  Patient does report that patient occasionally feels a fluttering in his chest but no current chest pain, chest tightness or shortness of breath.      Physical Exam   Triage Vital Signs: ED Triage Vitals  Enc Vitals Group     BP 11/28/22 1548 122/86     Pulse Rate 11/28/22 1548 100     Resp 11/28/22 1548 18     Temp 11/28/22 1548 98 F (36.7 C)     Temp Source 11/28/22 1548 Oral     SpO2 11/28/22 1548 97 %     Weight 11/28/22 1550 189 lb 9.5 oz (86 kg)     Height 11/28/22 1550 '5\' 8"'$  (1.727 m)     Head Circumference --      Peak Flow --      Pain Score 11/28/22 1550 2     Pain Loc --      Pain Edu? --      Excl. in [place]? --     Most recent vital signs: Vitals:   11/28/22 1548  BP: 122/86  Pulse: 100  Resp: 18  Temp: 98 F (36.7 C)  SpO2: 97%     Constitutional: Alert and oriented. Patient is lying supine. Eyes: Conjunctivae are normal. PERRL. EOMI. Head: Atraumatic. ENT:      Ears: Tympanic membranes are mildly injected with mild effusion bilaterally.       Nose: No congestion/rhinnorhea.      Mouth/Throat: Mucous membranes are moist. Posterior pharynx is mildly erythematous.  Hematological/Lymphatic/Immunilogical: No cervical lymphadenopathy.  Cardiovascular: Normal rate, regular rhythm. Normal S1 and S2.  Good peripheral circulation. Respiratory: Normal respiratory effort without tachypnea or retractions. Lungs CTAB. Good air entry to the bases with no decreased or absent breath sounds. Gastrointestinal: Bowel sounds 4 quadrants. Soft and nontender to palpation. No guarding or rigidity. No palpable  masses. No distention. No CVA tenderness. Musculoskeletal: Full range of motion to all extremities. No gross deformities appreciated. Neurologic:  Normal speech and language. No gross focal neurologic deficits are appreciated.  Skin:  Skin is warm, dry and intact. No rash noted. Psychiatric: Mood and affect are normal. Speech and behavior are normal. Patient exhibits appropriate insight and judgement.    ED Results / Procedures / Treatments   Labs (all labs ordered are listed, but only abnormal results are displayed) Labs Reviewed  RESP PANEL BY RT-PCR (RSV, FLU A&B, COVID)  [id number]       PROCEDURES:  Critical Care performed: No  Procedures   MEDICATIONS ORDERED IN ED: Medications - No data to display   IMPRESSION / MDM / [place] / ED COURSE  I reviewed the triage vital signs and the nursing notes.                              Assessment and plan:  Viral URI 27 year old male presents to the emergency department with cough, nasal congestion and rhinorrhea.  Patient was mildly tachycardic at triage but vital signs otherwise reassuring.  On exam, patient alert, active and nontoxic-appearing.  COVID and flu  negative.  Chest x-ray unremarkable.  Supportive medications were encouraged for home use.  Return precautions were given to return with new or worsening symptoms.      FINAL CLINICAL IMPRESSION(S) / ED DIAGNOSES   Final diagnoses:  Viral URI with cough     Rx / DC Orders   ED Discharge Orders          Ordered    benzonatate (TESSALON PERLES) 100 MG capsule  3 times daily PRN        11/28/22 1656             Note:  This document was prepared using Dragon voice recognition software and may include unintentional dictation errors.   Vallarie Mare Sheridan, Hershal Coria 11/28/22 Modena Slater, MD 11/28/22 2302

## 2022-11-28 NOTE — ED Triage Notes (Signed)
Pt reports feeling sick for a few days. Endorses fever, cough, headache and congestion. Denies n/v/d.

## 2023-04-05 ENCOUNTER — Emergency Department: Payer: Medicaid Other

## 2023-04-05 ENCOUNTER — Emergency Department
Admission: EM | Admit: 2023-04-05 | Discharge: 2023-04-05 | Disposition: A | Discharge status: Home / Self Care | Payer: Medicaid Other | Attending: Student in an Organized Health Care Education/Training Program | Admitting: Student in an Organized Health Care Education/Training Program

## 2023-04-05 ENCOUNTER — Encounter: Payer: Self-pay | Admitting: Intensive Care

## 2023-04-05 ENCOUNTER — Other Ambulatory Visit: Payer: Self-pay

## 2023-04-05 DIAGNOSIS — R519 Headache, unspecified: Secondary | ICD-10-CM | POA: Diagnosis not present

## 2023-04-05 DIAGNOSIS — R002 Palpitations: Secondary | ICD-10-CM | POA: Insufficient documentation

## 2023-04-05 DIAGNOSIS — R0789 Other chest pain: Secondary | ICD-10-CM | POA: Diagnosis not present

## 2023-04-05 LAB — CBC
HCT: 49.9 % (ref 39.0–52.0)
Hemoglobin: 16.9 g/dL (ref 13.0–17.0)
MCH: 28.5 pg (ref 26.0–34.0)
MCHC: 33.9 g/dL (ref 30.0–36.0)
MCV: 84.1 fL (ref 80.0–100.0)
Platelets: 311 10*3/uL (ref 150–400)
RBC: 5.93 MIL/uL — ABNORMAL HIGH (ref 4.22–5.81)
RDW: 12.9 % (ref 11.5–15.5)
WBC: 7.8 10*3/uL (ref 4.0–10.5)
nRBC: 0 % (ref 0.0–0.2)

## 2023-04-05 LAB — TROPONIN I (HIGH SENSITIVITY)
Troponin I (High Sensitivity): <2 ng/L (ref <18)
Troponin I (High Sensitivity): <2 ng/L (ref <18)

## 2023-04-05 LAB — BASIC METABOLIC PANEL
Anion gap: 9 (ref 5–15)
BUN: 12 mg/dL (ref 6–20)
CO2: 25 mmol/L (ref 22–32)
Calcium: 9.6 mg/dL (ref 8.9–10.3)
Chloride: 104 mmol/L (ref 98–111)
Creatinine, Ser: 0.6 mg/dL — ABNORMAL LOW (ref 0.61–1.24)
GFR, Estimated: >60 mL/min (ref >60)
Glucose, Bld: 98 mg/dL (ref 70–99)
Potassium: 4.1 mmol/L (ref 3.5–5.1)
Sodium: 138 mmol/L (ref 135–145)

## 2023-04-05 NOTE — ED Notes (Signed)
Chest pain, heart beating fast, L arm numbness/tingling that woke pt up this 0800; pt took 81mg  ASA today bc of symptoms; currently head pain and palpitations; denies CP and SOB currently. Denies sensitivity to light and sound currently. Denies nausea. Pt was asleep upon this RN's entrance to room. Pt calm, skin dry and resp reg/unlabored currently. Speech clear. Denies pain upon taking deep breath. Dry cough noted.

## 2023-04-05 NOTE — ED Provider Notes (Signed)
[facility] Provider Note    Event Date/Time   First MD Initiated Contact with Patient 04/05/23 [phone]     (approximate)   History   Chest Pain   HPI  Gerald Olsen is a 27 y.o. adult with a history of ADHD, anxiety, OCD, Tourette's presents to the ER for evaluation of headache palpitations and chest discomfort this morning.  Currently feels well but is particular concerned about the headache.  This not worst headache of their life.  No fevers.  No neck stiffness.     Physical Exam   Triage Vital Signs: ED Triage Vitals  Encounter Vitals Group     BP 04/05/23 0824 122/72     Systolic BP Percentile --      Diastolic BP Percentile --      Pulse Rate 04/05/23 0824 88     Resp 04/05/23 0824 16     Temp 04/05/23 0824 97.9 F (36.6 C)     Temp Source 04/05/23 0824 Oral     SpO2 04/05/23 0824 98 %     Weight 04/05/23 0820 207 lb (93.9 kg)     Height 04/05/23 0820 5\' 8"  (1.727 m)     Head Circumference --      Peak Flow --      Pain Score 04/05/23 0820 8     Pain Loc --      Pain Education --      Exclude from Growth Chart --     Most recent vital signs: Vitals:   04/05/23 0824 04/05/23 1023  BP: 122/72   Pulse: 88 85  Resp: 16 18  Temp: 97.9 F (36.6 C)   SpO2: 98% 97%     Constitutional: Alert  Eyes: Conjunctivae are normal.  Head: Atraumatic. Nose: No congestion/rhinnorhea. Mouth/Throat: Mucous membranes are moist.   Neck: Painless ROM.  Cardiovascular:   Good peripheral circulation. Respiratory: Normal respiratory effort.  No retractions.  Gastrointestinal: Soft and nontender.  Musculoskeletal:  no deformity Neurologic:  MAE spontaneously. No gross focal neurologic deficits are appreciated.  Skin:  Skin is warm, dry and intact. No rash noted. Psychiatric: Mood and affect are normal. Speech and behavior are normal.    ED Results / Procedures / Treatments   Labs (all labs ordered are listed, but only abnormal results are  displayed) Labs Reviewed  BASIC METABOLIC PANEL - Abnormal; Notable for the following components:      Result Value   Creatinine, Ser 0.60 (*)    All other components within normal limits  CBC - Abnormal; Notable for the following components:   RBC 5.93 (*)    All other components within normal limits  TROPONIN I (HIGH SENSITIVITY)  TROPONIN I (HIGH SENSITIVITY)     EKG  ED ECG REPORT I, Willy Eddy, the attending physician, personally viewed and interpreted this ECG.   Date: 04/05/2023  EKG Time: 8:20  Rate: 85  Rhythm: sinus  Axis: normal  Intervals: normal  ST&T Change: no stemi, no depressions    RADIOLOGY Please see ED Course for my review and interpretation.  I personally reviewed all radiographic images ordered to evaluate for the above acute complaints and reviewed radiology reports and findings.  These findings were personally discussed with the patient.  Please see medical record for radiology report.    PROCEDURES:  Critical Care performed:   Procedures   MEDICATIONS ORDERED IN ED: Medications - No data to display   IMPRESSION / MDM / ASSESSMENT  AND PLAN / ED COURSE  I reviewed the triage vital signs and the nursing notes.                              Differential diagnosis includes, but is not limited to, dysrhythmia, anxiety, SAH, IPH, mass, migraine, tension  Patient presenting to the ER for evaluation of symptoms as described above.  Based on symptoms, risk factors and considered above differential, this presenting complaint could reflect a potentially life-threatening illness therefore the patient will be placed on continuous pulse oximetry and telemetry for monitoring.  Laboratory evaluation will be sent to evaluate for the above complaints.  Patient well-appearing no acute distress.  Vital signs are stable.  EKG is nonischemic with no evidence of preexcitation syndrome.  Troponin negative.  Low risk by Wells criteria PERC negative.  CT head  on my review and interpretation without evidence of bleed.  Per radiology no acute findings.  Patient asymptomatic and is appropriate for outpatient follow-up.    Clinical Course as of 04/05/23 1055  Thu Apr 05, 2023  9604 Creatinine(!): 0.60 [PR]    Clinical Course User Index [PR] Willy Eddy, MD     FINAL CLINICAL IMPRESSION(S) / ED DIAGNOSES   Final diagnoses:  Acute nonintractable headache, unspecified headache type     Rx / DC Orders   ED Discharge Orders     None        Note:  This document was prepared using Dragon voice recognition software and may include unintentional dictation errors.    Willy Eddy, MD 04/05/23 1055

## 2023-04-05 NOTE — ED Triage Notes (Signed)
Patient c/o central and left sided chest tightness and sharp pains X2-3 weeks. Denies radiation. Reports intermittent head pains.   Reports taking 4, 81mg  aspirins this AM

## 2023-04-05 NOTE — Discharge Instructions (Signed)

## 2023-04-05 NOTE — ED Notes (Signed)
Pt offered warm blanket. Pt declined offer. Pt requested apple juice. Explained must wait for imaging to result.

## 2023-06-26 ENCOUNTER — Other Ambulatory Visit: Payer: Self-pay

## 2023-06-26 ENCOUNTER — Emergency Department
Admission: EM | Admit: 2023-06-26 | Discharge: 2023-06-26 | Disposition: A | Discharge status: Home / Self Care | Payer: Medicaid Other | Attending: Emergency Medicine | Admitting: Emergency Medicine

## 2023-06-26 DIAGNOSIS — F419 Anxiety disorder, unspecified: Secondary | ICD-10-CM | POA: Insufficient documentation

## 2023-06-26 DIAGNOSIS — R0602 Shortness of breath: Secondary | ICD-10-CM | POA: Diagnosis not present

## 2023-06-26 NOTE — ED Triage Notes (Signed)
Pt here with SOB and near syncope. Pt states he stood too fast and felt like he was going to pass out. Pt felt like he wasn't getting enough air in. Pt denies pain.

## 2023-06-26 NOTE — ED Provider Notes (Signed)
[facility] Provider Note    Event Date/Time   First MD Initiated Contact with Patient 06/26/23 1611     (approximate)   History   Shortness of Breath   HPI  Gerald Olsen is a 27 y.o. adult history of anxiety, Tourette's, OCD, ADHD presents emergency department stating that she stood up too fast and fell like she was going to pass out.  Patient took her sertraline this morning.  Not take alcohol to go 100 mg.  Event happened after that.  Patient no longer feels like she is having shortness of breath or dizziness.  No pain in her legs.  Patient is not on estrogen therapy.      Physical Exam   Triage Vital Signs: ED Triage Vitals  Encounter Vitals Group     BP 06/26/23 1506 111/72     Systolic BP Percentile --      Diastolic BP Percentile --      Pulse Rate 06/26/23 1506 90     Resp 06/26/23 1506 17     Temp 06/26/23 1506 98.8 F (37.1 C)     Temp Source 06/26/23 1506 Oral     SpO2 06/26/23 1506 97 %     Weight 06/26/23 1507 207 lb 0.2 oz (93.9 kg)     Height 06/26/23 1507 5\' 8"  (1.727 m)     Head Circumference --      Peak Flow --      Pain Score 06/26/23 1507 0     Pain Loc --      Pain Education --      Exclude from Growth Chart --     Most recent vital signs: Vitals:   06/26/23 1506  BP: 111/72  Pulse: 90  Resp: 17  Temp: 98.8 F (37.1 C)  SpO2: 97%     General: Awake, no distress.   CV:  Good peripheral perfusion. regular rate and  rhythm Resp:  Normal effort. Lungs need a Abd:  No distention.   Other:      ED Results / Procedures / Treatments   Labs (all labs ordered are listed, but only abnormal results are displayed) Labs Reviewed - No data to display   EKG     RADIOLOGY     PROCEDURES:   Procedures   MEDICATIONS ORDERED IN ED: Medications - No data to display   IMPRESSION / MDM / ASSESSMENT AND PLAN / ED COURSE  I reviewed the triage vital signs and the nursing notes.                               Differential diagnosis includes, but is not limited to, anxiety, viral illness, syncope, PE  Patient's presentation is most consistent with acute illness / injury with system symptoms.   I did consider PE/cardiac type syncope but patient is feeling so much better.  Admits to feeling this way prior with an anxiety attack.  I feel that she is stable and may be discharged to home.  Everything appears to be benign.  Discharged stable condition.      FINAL CLINICAL IMPRESSION(S) / ED DIAGNOSES   Final diagnoses:  Anxiety     Rx / DC Orders   ED Discharge Orders     None        Note:  This document was prepared using Dragon voice recognition software and may include unintentional dictation errors.  Faythe Ghee, PA-C 06/26/23 Hollace Hayward    Shaune Pollack, MD 06/26/23 [phone]

## 2023-07-12 ENCOUNTER — Other Ambulatory Visit: Payer: Self-pay

## 2023-07-12 ENCOUNTER — Emergency Department
Admission: EM | Admit: 2023-07-12 | Discharge: 2023-07-12 | Disposition: A | Discharge status: Home / Self Care | Payer: Medicaid Other | Attending: Emergency Medicine | Admitting: Emergency Medicine

## 2023-07-12 ENCOUNTER — Emergency Department: Payer: Medicaid Other

## 2023-07-12 ENCOUNTER — Encounter: Payer: Self-pay | Admitting: Emergency Medicine

## 2023-07-12 DIAGNOSIS — R002 Palpitations: Secondary | ICD-10-CM | POA: Insufficient documentation

## 2023-07-12 DIAGNOSIS — Z7982 Long term (current) use of aspirin: Secondary | ICD-10-CM | POA: Insufficient documentation

## 2023-07-12 DIAGNOSIS — R0789 Other chest pain: Secondary | ICD-10-CM | POA: Insufficient documentation

## 2023-07-12 DIAGNOSIS — R079 Chest pain, unspecified: Secondary | ICD-10-CM

## 2023-07-12 DIAGNOSIS — R0602 Shortness of breath: Secondary | ICD-10-CM | POA: Insufficient documentation

## 2023-07-12 LAB — CBC WITH DIFFERENTIAL/PLATELET
Abs Immature Granulocytes: 0.02 10*3/uL (ref 0.00–0.07)
Basophils Absolute: 0 10*3/uL (ref 0.0–0.1)
Basophils Relative: 1 %
Eosinophils Absolute: 0.5 10*3/uL (ref 0.0–0.5)
Eosinophils Relative: 7 %
HCT: 47.9 % (ref 39.0–52.0)
Hemoglobin: 16.4 g/dL (ref 13.0–17.0)
Immature Granulocytes: 0 %
Lymphocytes Relative: 38 %
Lymphs Abs: 2.9 10*3/uL (ref 0.7–4.0)
MCH: 28.6 pg (ref 26.0–34.0)
MCHC: 34.2 g/dL (ref 30.0–36.0)
MCV: 83.6 fL (ref 80.0–100.0)
Monocytes Absolute: 0.8 10*3/uL (ref 0.1–1.0)
Monocytes Relative: 10 %
Neutro Abs: 3.3 10*3/uL (ref 1.7–7.7)
Neutrophils Relative %: 44 %
Platelets: 306 10*3/uL (ref 150–400)
RBC: 5.73 MIL/uL (ref 4.22–5.81)
RDW: 12.8 % (ref 11.5–15.5)
WBC: 7.6 10*3/uL (ref 4.0–10.5)
nRBC: 0 % (ref 0.0–0.2)

## 2023-07-12 LAB — BASIC METABOLIC PANEL
Anion gap: 8 (ref 5–15)
BUN: 14 mg/dL (ref 6–20)
CO2: 26 mmol/L (ref 22–32)
Calcium: 9.5 mg/dL (ref 8.9–10.3)
Chloride: 104 mmol/L (ref 98–111)
Creatinine, Ser: 0.65 mg/dL (ref 0.61–1.24)
GFR, Estimated: >60 mL/min (ref >60)
Glucose, Bld: 100 mg/dL — ABNORMAL HIGH (ref 70–99)
Potassium: 4.4 mmol/L (ref 3.5–5.1)
Sodium: 138 mmol/L (ref 135–145)

## 2023-07-12 LAB — TROPONIN I (HIGH SENSITIVITY): Troponin I (High Sensitivity): <2 ng/L (ref <18)

## 2023-07-12 LAB — D-DIMER, QUANTITATIVE: D-Dimer, Quant: 0.29 ug{FEU}/mL (ref 0.00–0.50)

## 2023-07-12 LAB — TSH: TSH: 3.912 u[IU]/mL (ref 0.350–4.500)

## 2023-07-12 LAB — MAGNESIUM: Magnesium: 2.3 mg/dL (ref 1.7–2.4)

## 2023-07-12 NOTE — ED Triage Notes (Signed)
Patient ambulatory to triage with complaints of '7 palpitations back to back' approximately 1 hour ago. Patient states it felt squeezing and painful. Still endorses residual chest pain currently. States it was accompanied by chills. EKG completed in triage.

## 2023-07-12 NOTE — ED Provider Notes (Signed)
[facility] Provider Note    Event Date/Time   First MD Initiated Contact with Patient 07/12/23 [phone]     (approximate)   History   Palpitations   HPI  Gerald Olsen is a 27 y.o. adult history of anxiety, OCD, Tourette's who presents to the emergency department with palpitations and squeezing left-sided chest pain with shortness of breath that started tonight just prior to arrival.  Palpitations have resolved.  No longer feeling short of breath but still having chest pain.  States was drinking a glass of black tea when symptoms started.  No history of PE, DVT, exogenous estrogen use, recent fractures, surgery, trauma, hospitalization, prolonged travel or other immobilization. No lower extremity swelling or pain. No calf tenderness.  No fevers or cough.  History provided by patient.    Past Medical History:  Diagnosis Date   ADHD    Anxiety    Chest pain    OCD (obsessive compulsive disorder)    Tourette's     Past Surgical History:  Procedure Laterality Date   INGUINAL HERNIA REPAIR  Infant   As Infant    MEDICATIONS:  Prior to Admission medications   Medication Sig Start Date End Date Taking? Authorizing Provider  albuterol (VENTOLIN HFA) 108 (90 Base) MCG/ACT inhaler Inhale 2 puffs into the lungs every 6 (six) hours as needed for wheezing or shortness of breath. 02/28/22   Chesley Noon, MD  ARIPiprazole (ABILIFY) 2 MG tablet Take by mouth. 03/16/22   [provider]  aspirin EC 81 MG tablet Take by mouth.    [provider]  busPIRone (BUSPAR) 10 MG tablet Take 10 mg by mouth 2 (two) times daily. 05/24/22   [provider]  clonazePAM Scarlette Calico) 0.5 MG tablet Take by mouth. 05/15/22   [provider]  fluvoxaMINE (LUVOX) 100 MG tablet Take 100 mg by mouth 2 (two) times daily. 01/04/22   [provider]  hydrOXYzine (ATARAX) 50 MG tablet Take by mouth.    [provider]  LORazepam (ATIVAN)  0.5 MG tablet Take 0.5 mg by mouth daily as needed. 01/13/22   [provider]  metoCLOPramide (REGLAN) 10 MG tablet Take 1 tablet (10 mg total) by mouth 3 (three) times daily with meals. 07/06/22 07/06/23  Delton Prairie, MD  pantoprazole (PROTONIX) 20 MG tablet Take by mouth. 02/08/22   [provider]  phenol (CHLORASEPTIC MOUTH PAIN) 1.4 % LIQD Use as directed 1 spray in the mouth or throat as needed for throat irritation / pain. 02/11/22   Merwyn Katos, MD  QUEtiapine (SEROQUEL) 50 MG tablet Take 50 mg by mouth at bedtime. 03/22/22   [provider]  sertraline (ZOLOFT) 25 MG tablet Take 1 tablet (25 mg total) by mouth daily. 07/23/22 08/22/22  Concha Se, MD  topiramate (TOPAMAX) 50 MG tablet Take 50 mg by mouth at bedtime. 04/05/22   [provider]    Physical Exam   Triage Vital Signs: ED Triage Vitals  Encounter Vitals Group     BP 07/12/23 0428 124/80     Systolic BP Percentile --      Diastolic BP Percentile --      Pulse Rate 07/12/23 0428 92     Resp 07/12/23 0428 16     Temp 07/12/23 0428 98.7 F (37.1 C)     Temp Source 07/12/23 0428 Oral     SpO2 07/12/23 0428 97 %     Weight 07/12/23 0427 204  lb (92.5 kg)     Height 07/12/23 0427 5\' 8"  (1.727 m)     Head Circumference --      Peak Flow --      Pain Score 07/12/23 0427 3     Pain Loc --      Pain Education --      Exclude from Growth Chart --     Most recent vital signs: Vitals:   07/12/23 0428  BP: 124/80  Pulse: 92  Resp: 16  Temp: 98.7 F (37.1 C)  SpO2: 97%    CONSTITUTIONAL: Alert, responds appropriately to questions. Well-appearing; well-nourished HEAD: Normocephalic, atraumatic EYES: Conjunctivae clear, pupils appear equal, sclera nonicteric ENT: normal nose; moist mucous membranes NECK: Supple, normal ROM CARD: RRR; S1 and S2 appreciated RESP: Normal chest excursion without splinting or tachypnea; breath sounds clear and equal bilaterally; no wheezes, no  rhonchi, no rales, no hypoxia or respiratory distress, speaking full sentences ABD/GI: Non-distended; soft, non-tender, no rebound, no guarding, no peritoneal signs BACK: The back appears normal EXT: Normal ROM in all joints; no deformity noted, no edema, no calf tenderness or calf swelling SKIN: Normal color for age and race; warm; no rash on exposed skin NEURO: Moves all extremities equally, normal speech PSYCH: The patient's mood and manner are appropriate.   ED Results / Procedures / Treatments   LABS: (all labs ordered are listed, but only abnormal results are displayed) Labs Reviewed  BASIC METABOLIC PANEL - Abnormal; Notable for the following components:      Result Value   Glucose, Bld 100 (*)    All other components within normal limits  CBC WITH DIFFERENTIAL/PLATELET  MAGNESIUM  TSH  D-DIMER, QUANTITATIVE  TROPONIN I (HIGH SENSITIVITY)  TROPONIN I (HIGH SENSITIVITY)     EKG:   Date: 07/12/2023 4:33 AM  Rate: 85  Rhythm: normal sinus rhythm  QRS Axis: normal  Intervals: normal  ST/T Wave abnormalities: normal  Conduction Disutrbances: none  Narrative Interpretation: unremarkable     RADIOLOGY: My personal review and interpretation of imaging: Chest x-ray clear.  I have personally reviewed all radiology reports.   No results found.   PROCEDURES:  Critical Care performed: No   CRITICAL CARE Performed by: Rochele Raring   Total critical care time: 0 minutes  Critical care time was exclusive of separately billable procedures and treating other patients.  Critical care was necessary to treat or prevent imminent or life-threatening deterioration.  Critical care was time spent personally by me on the following activities: development of treatment plan with patient and/or surrogate as well as nursing, discussions with consultants, evaluation of patient's response to treatment, examination of patient, obtaining history from patient or surrogate, ordering  and performing treatments and interventions, ordering and review of laboratory studies, ordering and review of radiographic studies, pulse oximetry and re-evaluation of patient's condition.   Marland Kitchen1-3 Lead EKG Interpretation  Performed by: Syliva Mee, Layla Maw, DO Authorized by: Ericson Nafziger, Layla Maw, DO     Interpretation: normal     ECG rate:  95   ECG rate assessment: normal     Rhythm: sinus rhythm     Ectopy: none     Conduction: normal       IMPRESSION / MDM / ASSESSMENT AND PLAN / ED COURSE  I reviewed the triage vital signs and the nursing notes.    Patient here with chest pain, palpitations.  The patient is on the cardiac monitor to evaluate for evidence of arrhythmia and/or significant heart rate  changes.   DIFFERENTIAL DIAGNOSIS (includes but not limited to):   Anxiety, anemia, electrolyte derangement, PE, thyroid dysfunction, less likely ACS, dissection, pneumonia, CHF   Patient's presentation is most consistent with acute presentation with potential threat to life or bodily function.   PLAN: Will obtain labs with electrolytes, thyroid function, troponin, D-dimer.  Will obtain chest x-ray.  EKG nonischemic without interval abnormalities, arrhythmia.  Will place on cardiac monitoring.  Patient denies any pain medication for residual chest discomfort.   MEDICATIONS GIVEN IN ED: Medications - No data to display   ED COURSE: No evidence seen on cardiac monitoring.  Patient remains hemodynamically stable.  Normal hemoglobin, electrolytes, TSH.  Negative troponin and D-dimer.  Chest x-ray reviewed and interpreted by myself and shows no acute abnormality.  Awaiting official read from radiology but there are significant delays.  Do not feel patient needs to wait for imaging results at this time.  Will discharge home.  At this time, I do not feel there is any life-threatening condition present. I reviewed all nursing notes, vitals, pertinent previous records.  All lab and urine results,  EKGs, imaging ordered have been independently reviewed and interpreted by myself.  I reviewed all available radiology reports from any imaging ordered this visit.  Based on my assessment, I feel the patient is safe to be discharged home without further emergent workup and can continue workup as an outpatient as needed. Discussed all findings, treatment plan as well as usual and customary return precautions.  They verbalize understanding and are comfortable with this plan.  Outpatient follow-up has been provided as needed.  All questions have been answered.    CONSULTS:  none   OUTSIDE RECORDS REVIEWED: Reviewed previous neurology note on 05/15/2022.       FINAL CLINICAL IMPRESSION(S) / ED DIAGNOSES   Final diagnoses:  Palpitations  Chest pain, unspecified type     Rx / DC Orders   ED Discharge Orders     None        Note:  This document was prepared using Dragon voice recognition software and may include unintentional dictation errors.   Dionicia Cerritos, Layla Maw, DO 07/12/23 [phone]

## 2023-07-12 NOTE — Discharge Instructions (Addendum)
Your lab work, EKG and chest x-ray today were reassuring.  Please follow-up with your primary care doctor.

## 2023-12-03 ENCOUNTER — Emergency Department
Admission: EM | Admit: 2023-12-03 | Discharge: 2023-12-03 | Disposition: A | Discharge status: Home / Self Care | Attending: Emergency Medicine | Admitting: Emergency Medicine

## 2023-12-03 DIAGNOSIS — R519 Headache, unspecified: Secondary | ICD-10-CM | POA: Insufficient documentation

## 2023-12-03 DIAGNOSIS — R42 Dizziness and giddiness: Secondary | ICD-10-CM | POA: Diagnosis present

## 2023-12-03 LAB — BASIC METABOLIC PANEL
Anion gap: 9 (ref 5–15)
BUN: 14 mg/dL (ref 6–20)
CO2: 28 mmol/L (ref 22–32)
Calcium: 9.7 mg/dL (ref 8.9–10.3)
Chloride: 103 mmol/L (ref 98–111)
Creatinine, Ser: 0.76 mg/dL (ref 0.61–1.24)
GFR, Estimated: >60 mL/min (ref >60)
Glucose, Bld: 108 mg/dL — ABNORMAL HIGH (ref 70–99)
Potassium: 4.2 mmol/L (ref 3.5–5.1)
Sodium: 140 mmol/L (ref 135–145)

## 2023-12-03 LAB — CBC
HCT: 45.4 % (ref 39.0–52.0)
Hemoglobin: 16.1 g/dL (ref 13.0–17.0)
MCH: 29.8 pg (ref 26.0–34.0)
MCHC: 35.5 g/dL (ref 30.0–36.0)
MCV: 83.9 fL (ref 80.0–100.0)
Platelets: 296 10*3/uL (ref 150–400)
RBC: 5.41 MIL/uL (ref 4.22–5.81)
RDW: 12.8 % (ref 11.5–15.5)
WBC: 9.9 10*3/uL (ref 4.0–10.5)
nRBC: 0 % (ref 0.0–0.2)

## 2023-12-03 LAB — URINALYSIS, ROUTINE W REFLEX MICROSCOPIC
Bilirubin Urine: NEGATIVE
Glucose, UA: NEGATIVE mg/dL
Hgb urine dipstick: NEGATIVE
Ketones, ur: NEGATIVE mg/dL
Leukocytes,Ua: NEGATIVE
Nitrite: NEGATIVE
Protein, ur: NEGATIVE mg/dL
Specific Gravity, Urine: 1.016 (ref 1.005–1.030)
pH: 5 (ref 5.0–8.0)

## 2023-12-03 MED ORDER — IBUPROFEN 600 MG PO TABS
600.0000 mg | ORAL_TABLET | Freq: Once | ORAL | Status: DC
  Filled 2023-12-03: qty 1

## 2023-12-03 MED ORDER — ACETAMINOPHEN 500 MG PO TABS
1000.0000 mg | ORAL_TABLET | Freq: Once | ORAL | Status: DC
  Filled 2023-12-03: qty 2

## 2023-12-03 NOTE — ED Provider Notes (Signed)
 [facility] Provider Note    Event Date/Time   First MD Initiated Contact with Patient 12/03/23 [phone]     (approximate)   History   Headache and Dizziness   HPI  Gerald Olsen is a 28 y.o. adult who presents to the ED for evaluation of Headache and Dizziness   Transgender male to male patient presents for evaluation of an episode of dizziness that has since resolved.  Reports laying in bed when she felt like the ceiling was moving and that she was not moving her head.  Reports this has improved and she is feeling much better by the time I see her.  Reports chronic intermittent head pain due to poor dentition without acute changes or fevers.  No falls, syncope or other concerns   Physical Exam   Triage Vital Signs: ED Triage Vitals  Encounter Vitals Group     BP 12/03/23 0443 137/85     Systolic BP Percentile --      Diastolic BP Percentile --      Pulse Rate 12/03/23 0443 96     Resp 12/03/23 0443 18     Temp 12/03/23 0443 98.6 F (37 C)     Temp Source 12/03/23 0443 Oral     SpO2 12/03/23 0443 96 %     Weight 12/03/23 0444 206 lb (93.4 kg)     Height 12/03/23 0444 5\' 8"  (1.727 m)     Head Circumference --      Peak Flow --      Pain Score 12/03/23 0443 3     Pain Loc --      Pain Education --      Exclude from Growth Chart --     Most recent vital signs: Vitals:   12/03/23 0443  BP: 137/85  Pulse: 96  Resp: 18  Temp: 98.6 F (37 C)  SpO2: 96%    General: Awake, no distress.  CV:  Good peripheral perfusion.  Resp:  Normal effort.  Abd:  No distention.  MSK:  No deformity noted.  Neuro:  No focal deficits appreciated. Other:     ED Results / Procedures / Treatments   Labs (all labs ordered are listed, but only abnormal results are displayed) Labs Reviewed  BASIC METABOLIC PANEL - Abnormal; Notable for the following components:      Result Value   Glucose, Bld 108 (*)    All other components within normal limits   URINALYSIS, ROUTINE W REFLEX MICROSCOPIC - Abnormal; Notable for the following components:   Color, Urine YELLOW (*)    APPearance CLEAR (*)    All other components within normal limits  CBC    EKG Sinus rhythm with a rate of 97 bpm.  Normal axis and intervals.  No clear signs of acute ischemia.  RADIOLOGY   Official radiology report(s): No results found.  PROCEDURES and INTERVENTIONS:  Procedures  Medications  acetaminophen (TYLENOL) tablet 1,000 mg (1,000 mg Oral Patient Refused/Not Given 12/03/23 0528)  ibuprofen (ADVIL) tablet 600 mg (600 mg Oral Not Given 12/03/23 0528)     IMPRESSION / MDM / ASSESSMENT AND PLAN / ED COURSE  I reviewed the triage vital signs and the nursing notes.  Differential diagnosis includes, but is not limited to, cardiac dysrhythmia, anxiety, BPPV, symptomatic anemia, sepsis  {Patient presents with symptoms of an acute illness or injury that is potentially life-threatening.  Patient presents with a resolving episode of dizziness suitable for outpatient management.  Reassuring EKG, blood work and urinalysis.      FINAL CLINICAL IMPRESSION(S) / ED DIAGNOSES   Final diagnoses:  Dizziness     Rx / DC Orders   ED Discharge Orders     None        Note:  This document was prepared using Dragon voice recognition software and may include unintentional dictation errors.   Delton [place], MD 12/03/23 [phone]

## 2023-12-03 NOTE — ED Triage Notes (Signed)
 Pt to ED c/o of headache and dizziness today that gets worse when they lay down. Pt states they have had an ongoing tooth infection for the last 2 years that radiates up into the head. No LOC. A&O x4

## 2024-03-17 ENCOUNTER — Other Ambulatory Visit: Payer: Self-pay

## 2024-03-17 ENCOUNTER — Emergency Department

## 2024-03-17 ENCOUNTER — Encounter: Payer: Self-pay | Admitting: Intensive Care

## 2024-03-17 ENCOUNTER — Emergency Department
Admission: EM | Admit: 2024-03-17 | Discharge: 2024-03-17 | Disposition: A | Discharge status: Home / Self Care | Attending: Emergency Medicine | Admitting: Emergency Medicine

## 2024-03-17 DIAGNOSIS — R519 Headache, unspecified: Secondary | ICD-10-CM | POA: Diagnosis present

## 2024-03-17 LAB — CBC WITH DIFFERENTIAL/PLATELET
Abs Immature Granulocytes: 0.01 10*3/uL (ref 0.00–0.07)
Basophils Absolute: 0 10*3/uL (ref 0.0–0.1)
Basophils Relative: 1 %
Eosinophils Absolute: 0.6 10*3/uL — ABNORMAL HIGH (ref 0.0–0.5)
Eosinophils Relative: 10 %
HCT: 48.2 % (ref 39.0–52.0)
Hemoglobin: 17.1 g/dL — ABNORMAL HIGH (ref 13.0–17.0)
Immature Granulocytes: 0 %
Lymphocytes Relative: 33 %
Lymphs Abs: 2.2 10*3/uL (ref 0.7–4.0)
MCH: 29.4 pg (ref 26.0–34.0)
MCHC: 35.5 g/dL (ref 30.0–36.0)
MCV: 83 fL (ref 80.0–100.0)
Monocytes Absolute: 0.6 10*3/uL (ref 0.1–1.0)
Monocytes Relative: 9 %
Neutro Abs: 3.1 10*3/uL (ref 1.7–7.7)
Neutrophils Relative %: 47 %
Platelets: 324 10*3/uL (ref 150–400)
RBC: 5.81 MIL/uL (ref 4.22–5.81)
RDW: 12.9 % (ref 11.5–15.5)
WBC: 6.6 10*3/uL (ref 4.0–10.5)
nRBC: 0 % (ref 0.0–0.2)

## 2024-03-17 LAB — COMPREHENSIVE METABOLIC PANEL WITH GFR
ALT: 46 U/L — ABNORMAL HIGH (ref 0–44)
AST: 34 U/L (ref 15–41)
Albumin: 4.8 g/dL (ref 3.5–5.0)
Alkaline Phosphatase: 67 U/L (ref 38–126)
Anion gap: 8 (ref 5–15)
BUN: 14 mg/dL (ref 6–20)
CO2: 25 mmol/L (ref 22–32)
Calcium: 9.8 mg/dL (ref 8.9–10.3)
Chloride: 105 mmol/L (ref 98–111)
Creatinine, Ser: 0.64 mg/dL (ref 0.61–1.24)
GFR, Estimated: >60 mL/min (ref >60)
Glucose, Bld: 102 mg/dL — ABNORMAL HIGH (ref 70–99)
Potassium: 4 mmol/L (ref 3.5–5.1)
Sodium: 138 mmol/L (ref 135–145)
Total Bilirubin: 1.1 mg/dL (ref 0.0–1.2)
Total Protein: 8 g/dL (ref 6.5–8.1)

## 2024-03-17 MED ORDER — IOHEXOL 350 MG/ML SOLN
75.0000 mL | Freq: Once | INTRAVENOUS | Status: AC | PRN
  Administered 2024-03-17: 75 mL via INTRAVENOUS

## 2024-03-17 MED ORDER — KETOROLAC TROMETHAMINE 15 MG/ML IJ SOLN
15.0000 mg | Freq: Once | INTRAMUSCULAR | Status: AC
  Administered 2024-03-17: 15 mg via INTRAVENOUS
  Filled 2024-03-17: qty 1

## 2024-03-17 NOTE — Discharge Instructions (Signed)
 You were seen in the emergency department for headache.  Your lab work was overall normal.  The CT scan of your head did not show any signs of a bleed and did not show any signs of blood clots or an obvious mass.  You are given information to follow-up with neurology.  Continue to follow-up with your primary care physician and return to the emergency department for any worsening symptoms.  Stay hydrated and drink plenty of fluids.  Pain control:  Ibuprofen  (motrin /aleve/advil ) - You can take 3 tablets (600 mg) every 6 hours as needed for pain/fever.  Acetaminophen  (tylenol ) - You can take 2 extra strength tablets (1000 mg) every 6 hours as needed for pain/fever.  You can alternate these medications or take them together.  Make sure you eat food/drink water when taking these medications.

## 2024-03-17 NOTE — ED Provider Notes (Signed)
 [facility] Provider Note    Event Date/Time   First MD Initiated Contact with Patient 03/17/24 1501     (approximate)   History   Headache   HPI  Gerald Olsen is a 28 y.o. adult no significant past medical history presents to the emergency department with headache.  Endorses 3 weeks of headache that has been intermittent and comes and goes to the top of her head.  Mild headache at this time.  Endorses intermittent episodes of change in vision.  Denies any change in vision at this time.  No slurring of speech, trouble swallowing, extremity numbness or weakness.  No prior headache.  Denies any significant daily NSAID use or Tylenol  use.  No falls or trauma.  Takes sertraline  on a daily basis.  Denies any history of blood clots.  No estrogen replacement.   Physical Exam   Triage Vital Signs: ED Triage Vitals  Encounter Vitals Group     BP 03/17/24 1326 136/61     Girls Systolic BP Percentile --      Girls Diastolic BP Percentile --      Boys Systolic BP Percentile --      Boys Diastolic BP Percentile --      Pulse Rate 03/17/24 1326 98     Resp 03/17/24 1326 16     Temp 03/17/24 1326 99.1 F (37.3 C)     Temp Source 03/17/24 1326 Oral     SpO2 03/17/24 1326 97 %     Weight 03/17/24 1327 216 lb (98 kg)     Height 03/17/24 1327 5' 8 (1.727 m)     Head Circumference --      Peak Flow --      Pain Score 03/17/24 1327 8     Pain Loc --      Pain Education --      Exclude from Growth Chart --     Most recent vital signs: Vitals:   03/17/24 1326  BP: 136/61  Pulse: 98  Resp: 16  Temp: 99.1 F (37.3 C)  SpO2: 97%    Physical Exam Constitutional:      Appearance: She is well-developed.  HENT:     Head: Atraumatic.   Eyes:     Conjunctiva/sclera: Conjunctivae normal.    Cardiovascular:     Rate and Rhythm: Regular rhythm.  Pulmonary:     Effort: No respiratory distress.   Musculoskeletal:     Cervical back: Normal range of  motion.   Skin:    General: Skin is warm.   Neurological:     Mental Status: She is alert. Mental status is at baseline.     GCS: GCS eye subscore is 4. GCS verbal subscore is 5. GCS motor subscore is 6.     Cranial Nerves: Cranial nerves 2-12 are intact.     Sensory: Sensation is intact.     Motor: Motor function is intact.     Coordination: Coordination is intact.     Gait: Gait is intact.     IMPRESSION / MDM / ASSESSMENT AND PLAN / ED COURSE  I reviewed the triage vital signs and the nursing notes.  Differential diagnosis including primary headache, cluster headache, cerebral venous thrombosis.  Have low suspicion for meningitis.  Has normal neurologic exam.  No thunderclap headache, have low suspicion for subarachnoid hemorrhage.  RADIOLOGY I independently reviewed imaging, my interpretation of imaging: CT scan of the head with no signs of intracranial hemorrhage  CTV  LABS (all labs ordered are listed, but only abnormal results are displayed) Labs interpreted as -    Labs Reviewed  CBC WITH DIFFERENTIAL/PLATELET - Abnormal; Notable for the following components:      Result Value   Hemoglobin 17.1 (*)    Eosinophils Absolute 0.6 (*)    All other components within normal limits  COMPREHENSIVE METABOLIC PANEL WITH GFR - Abnormal; Notable for the following components:   Glucose, Bld 102 (*)    ALT 46 (*)    All other components within normal limits     MDM Lab work with no significant electrolyte abnormality.  No white count but hemoglobin is elevated at 17.  Otherwise unremarkable lab work.  Has normal neurologic exam.  Will give IV Toradol for pain control.  Will obtain CT venogram to further evaluate for venous thrombosis.  Clinical Course as of 03/17/24 1719  Mon Mar 17, 2024  1716 CTV with no signs of venous thrombosis.  Pain well-controlled.  Will give a referral to neurology discussed return for any worsening symptoms.  Also discussed following up with an eye  doctor to check her vision. [SM]    Clinical Course User Index [SM] Suzanne Kirsch, MD     PROCEDURES:  Critical Care performed: No  Procedures  Patient's presentation is most consistent with acute presentation with potential threat to life or bodily function.   MEDICATIONS ORDERED IN ED: Medications  ketorolac (TORADOL) 15 MG/ML injection 15 mg (15 mg Intravenous Given 03/17/24 1557)  iohexol (OMNIPAQUE) 350 MG/ML injection 75 mL (75 mLs Intravenous Contrast Given 03/17/24 1623)    FINAL CLINICAL IMPRESSION(S) / ED DIAGNOSES   Final diagnoses:  Acute nonintractable headache, unspecified headache type     Rx / DC Orders   ED Discharge Orders     None        Note:  This document was prepared using Dragon voice recognition software and may include unintentional dictation errors.   Suzanne Kirsch, MD 03/17/24 1719

## 2024-03-17 NOTE — ED Triage Notes (Signed)
 Sent by PCP for CT head. Patient c/o intermittent headaches X2-3 weeks. No history of migraines. Reports intermittent vision changes but no vision changes at this time.   Denies injuries  A&O x4 in triage.

## 2024-06-01 IMAGING — CT CT HEAD W/O CM
4 series · 16 of 47 positions shown, 18 images · non-contrast
Comparison: February 13, 2022

CLINICAL DATA: Blurred vision and headache.



[Series 2: head wo · axial · 0.42mm/px · z∈[-110,-5]mm · 7 of 29 slices shown, 9 images]
[im 4/29  brain]
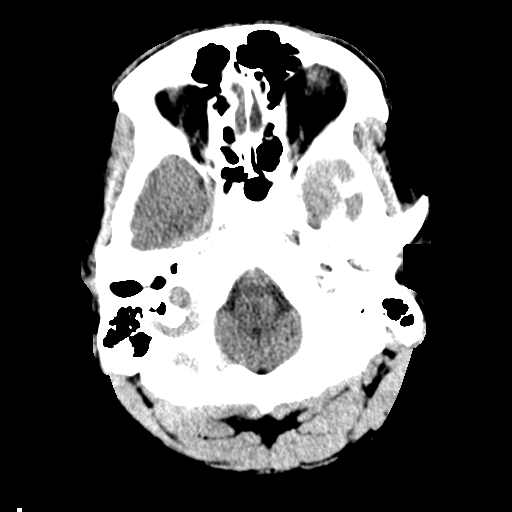
[im 4/29  bone]
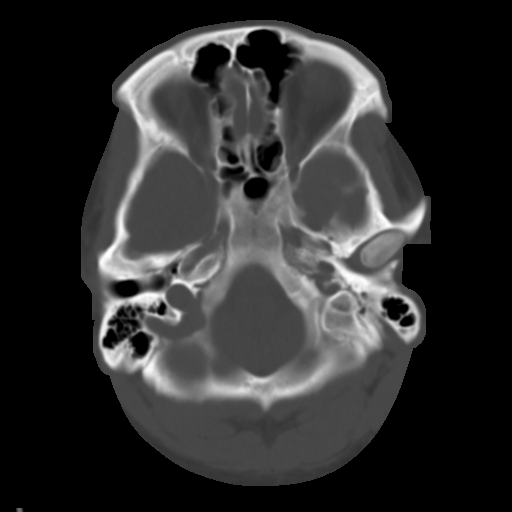
[im 8/29  brain]
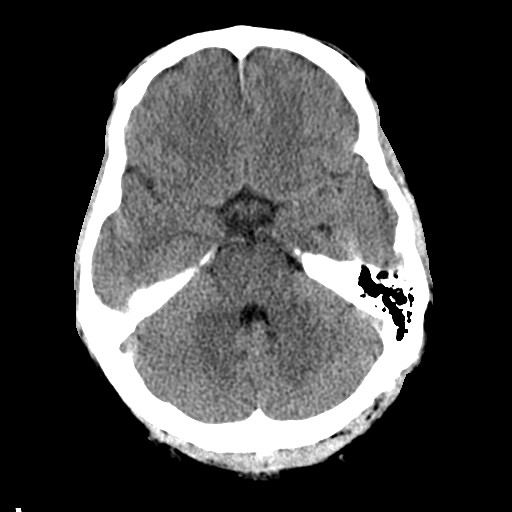
[im 11/29  brain]
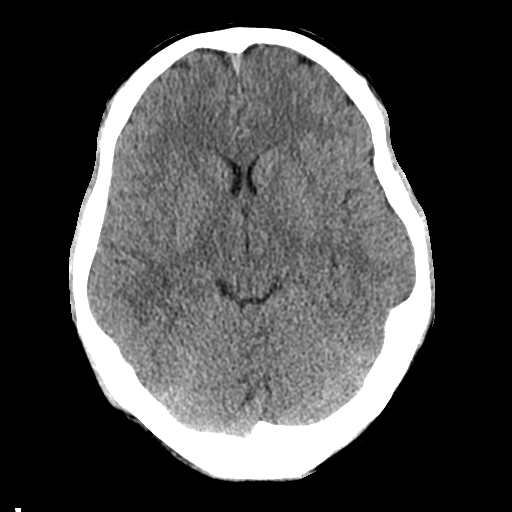
[im 15/29  brain]
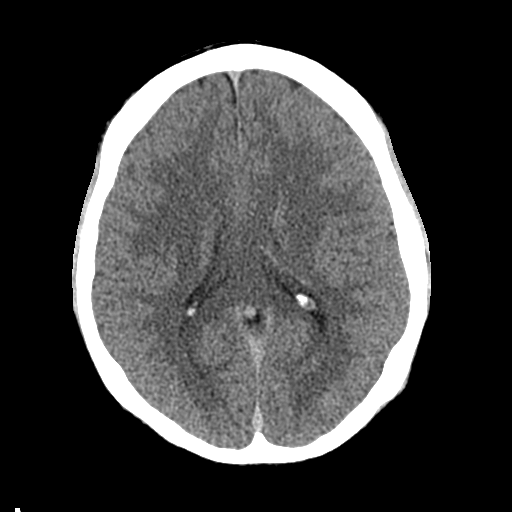
[im 18/29  brain]
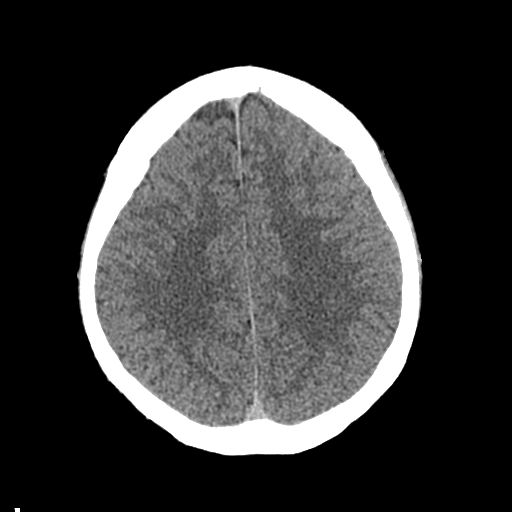
[im 18/29  bone]
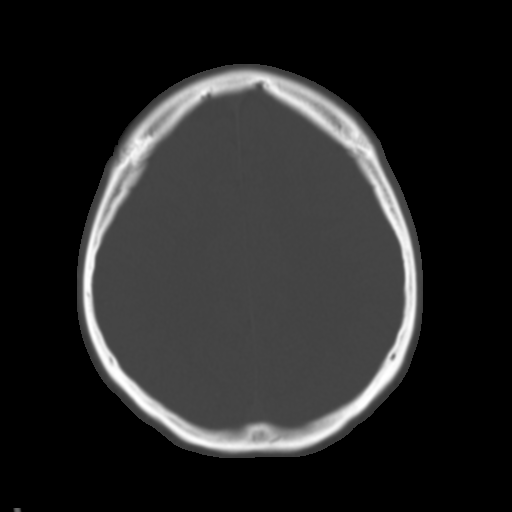
[im 22/29  brain]
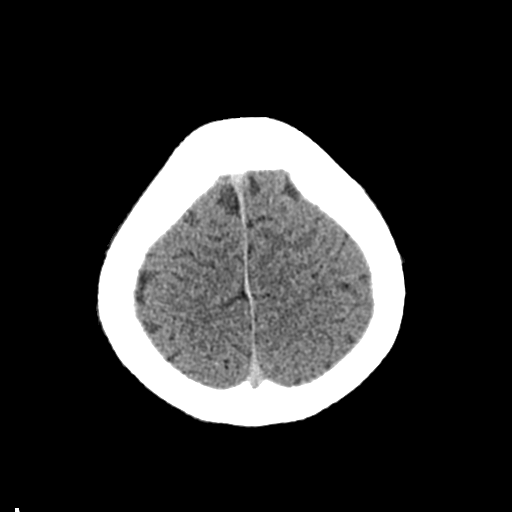
[im 25/29  brain]
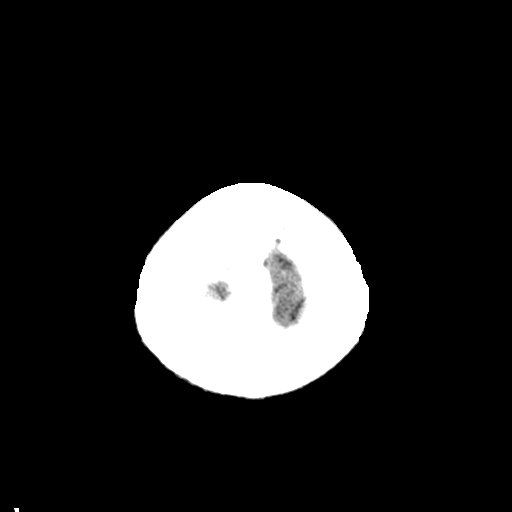

[Series 3: head bone · axial · 0.42mm/px · z∈[-111,-83]mm · 3 of 73 slices shown]
[im 8/73  bone]
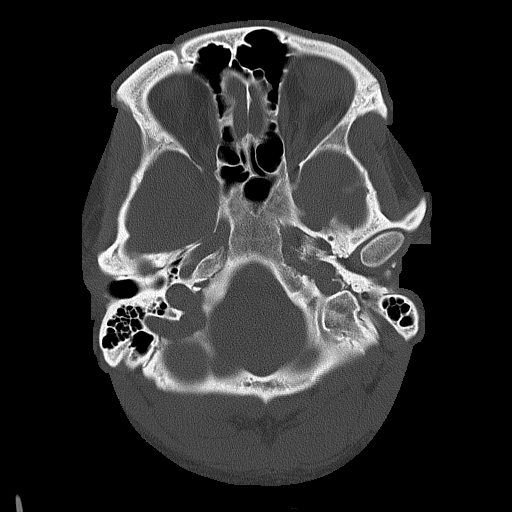
[im 15/73  bone]
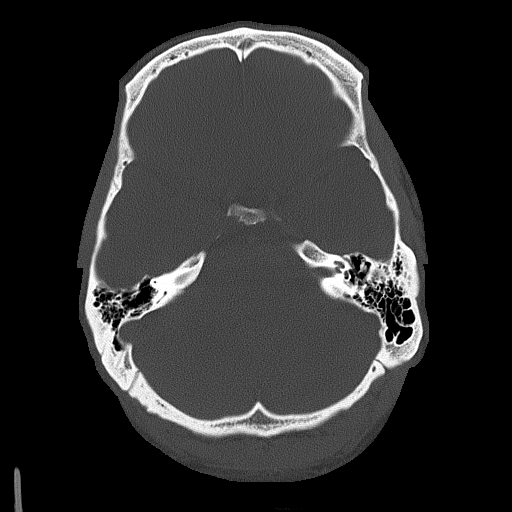
[im 22/73  bone]
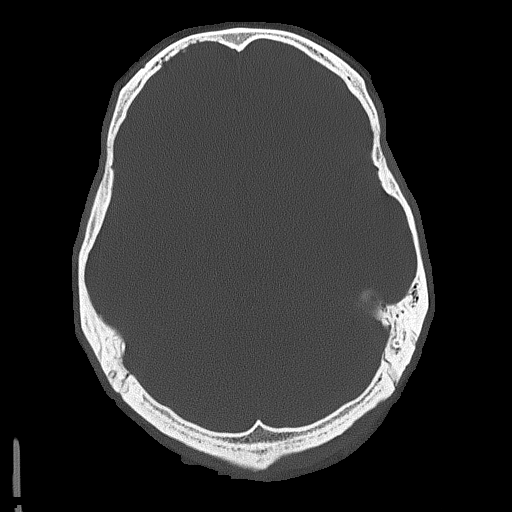

[Series 4: coronal soft tissue · coronal · 0.31mm/px · 3 of 67 slices shown]
[im 23/67  brain]
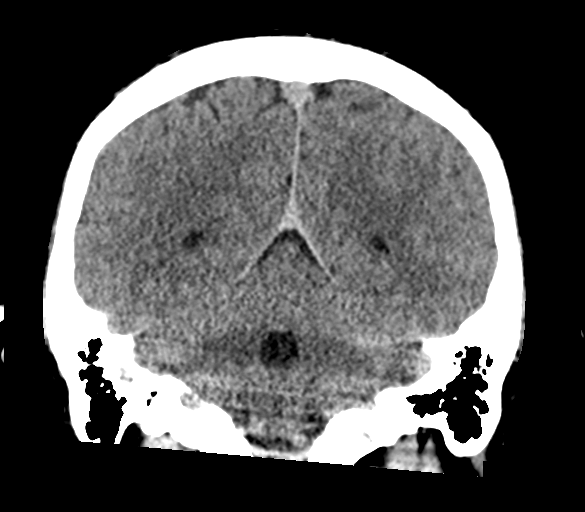
[im 30/67  brain]
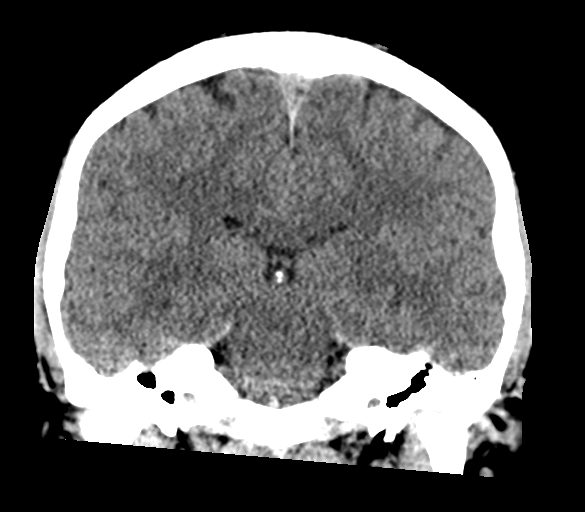
[im 37/67  brain]
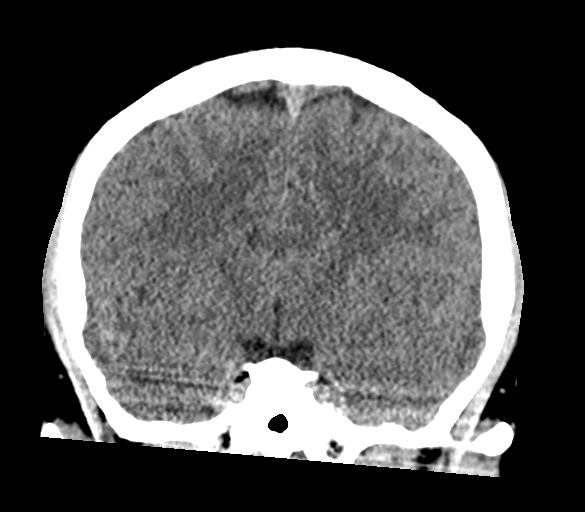

[Series 5: sagittal soft tissue · sagittal · 0.31mm/px · 3 of 58 slices shown]
[im 20/58  brain]
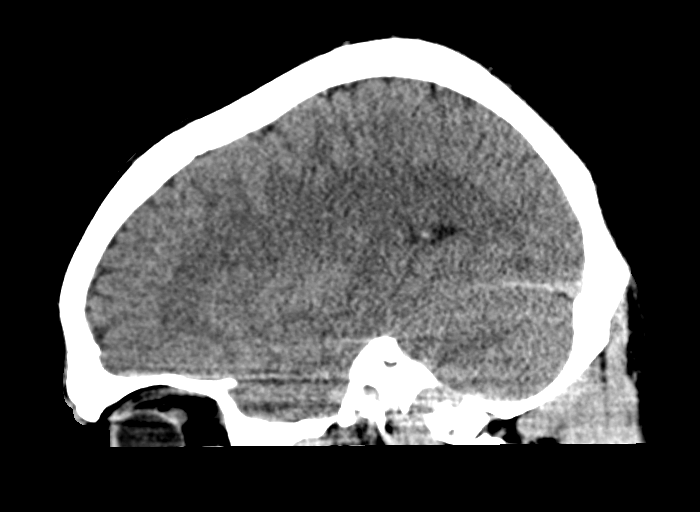
[im 29/58  brain]
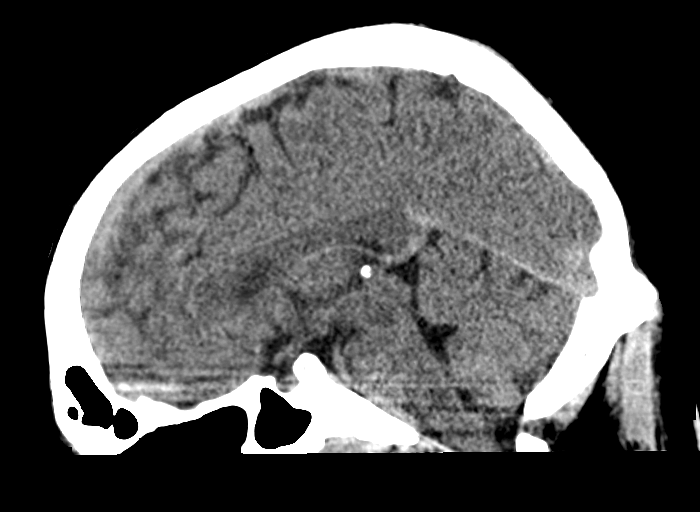
[im 39/58  brain]
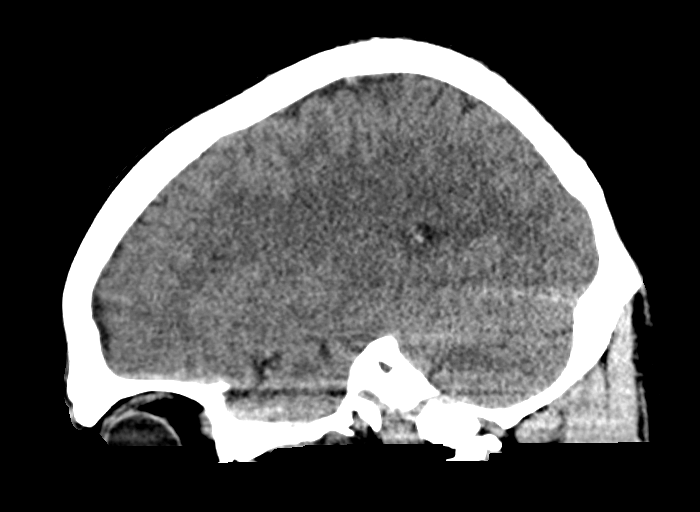

[16 of 47 positions shown; findings below may reference images not displayed]

FINDINGS: Brain: No evidence of acute infarction, hemorrhage, hydrocephalus,
extra-axial collection or mass lesion/mass effect.

Vascular: No hyperdense vessel or unexpected calcification.

Skull: Normal. Negative for fracture or focal lesion.

Sinuses/Orbits: No acute finding.

Other: None.
IMPRESSION: No acute intracranial pathology.

## 2024-06-04 ENCOUNTER — Ambulatory Visit: Payer: Medicaid Other | Admitting: Dermatology
